# Patient Record
Sex: Female | Born: 1965 | Race: White | Hispanic: No | Smoking: Never smoker
Health system: Southern US, Community
[De-identification: ages and names within clinical notes are randomized; demographics above are authoritative.]

## PROBLEM LIST (undated history)

## (undated) DIAGNOSIS — R112 Nausea with vomiting, unspecified: Secondary | ICD-10-CM

## (undated) DIAGNOSIS — Z9889 Other specified postprocedural states: Secondary | ICD-10-CM

## (undated) DIAGNOSIS — R51 Headache: Secondary | ICD-10-CM

## (undated) DIAGNOSIS — Z202 Contact with and (suspected) exposure to infections with a predominantly sexual mode of transmission: Secondary | ICD-10-CM

## (undated) DIAGNOSIS — IMO0002 Reserved for concepts with insufficient information to code with codable children: Secondary | ICD-10-CM

## (undated) DIAGNOSIS — Z8669 Personal history of other diseases of the nervous system and sense organs: Secondary | ICD-10-CM

## (undated) DIAGNOSIS — Z8709 Personal history of other diseases of the respiratory system: Secondary | ICD-10-CM

## (undated) DIAGNOSIS — T8859XA Other complications of anesthesia, initial encounter: Secondary | ICD-10-CM

## (undated) DIAGNOSIS — T4145XA Adverse effect of unspecified anesthetic, initial encounter: Secondary | ICD-10-CM

## (undated) HISTORY — DX: Reserved for concepts with insufficient information to code with codable children: IMO0002

## (undated) HISTORY — DX: Contact with and (suspected) exposure to infections with a predominantly sexual mode of transmission: Z20.2

## (undated) HISTORY — DX: Personal history of other diseases of the respiratory system: Z87.09

## (undated) HISTORY — DX: Headache: R51

## (undated) HISTORY — PX: BREAST EXCISIONAL BIOPSY: SUR124

## (undated) HISTORY — PX: BREAST BIOPSY: SHX20

## (undated) HISTORY — PX: NASAL POLYP EXCISION: SHX2068

## (undated) HISTORY — DX: Personal history of other diseases of the nervous system and sense organs: Z86.69

---

## 1996-10-08 HISTORY — PX: BREAST CYST EXCISION: SHX579

## 2002-07-24 ENCOUNTER — Encounter: Admission: RE | Admit: 2002-07-24 | Discharge: 2002-07-24 | Payer: Self-pay

## 2003-07-23 ENCOUNTER — Other Ambulatory Visit: Admission: RE | Admit: 2003-07-23 | Discharge: 2003-07-23 | Payer: Self-pay | Admitting: Family Medicine

## 2003-12-10 ENCOUNTER — Encounter: Admission: RE | Admit: 2003-12-10 | Discharge: 2003-12-10 | Payer: Self-pay | Admitting: Family Medicine

## 2004-07-17 ENCOUNTER — Encounter: Admission: RE | Admit: 2004-07-17 | Discharge: 2004-08-09 | Payer: Self-pay | Admitting: Family Medicine

## 2004-09-14 ENCOUNTER — Encounter: Admission: RE | Admit: 2004-09-14 | Discharge: 2004-09-14 | Payer: Self-pay | Admitting: Family Medicine

## 2004-12-27 ENCOUNTER — Encounter: Admission: RE | Admit: 2004-12-27 | Discharge: 2004-12-27 | Payer: Self-pay | Admitting: Family Medicine

## 2006-01-30 ENCOUNTER — Encounter: Admission: RE | Admit: 2006-01-30 | Discharge: 2006-01-30 | Payer: Self-pay | Admitting: Family Medicine

## 2006-02-06 ENCOUNTER — Encounter: Admission: RE | Admit: 2006-02-06 | Discharge: 2006-02-06 | Payer: Self-pay | Admitting: Family Medicine

## 2006-02-15 ENCOUNTER — Ambulatory Visit: Payer: Self-pay | Admitting: Internal Medicine

## 2006-02-21 ENCOUNTER — Other Ambulatory Visit: Admission: RE | Admit: 2006-02-21 | Discharge: 2006-02-21 | Payer: Self-pay | Admitting: Obstetrics & Gynecology

## 2006-03-13 ENCOUNTER — Ambulatory Visit: Payer: Self-pay | Admitting: Internal Medicine

## 2006-03-14 ENCOUNTER — Ambulatory Visit: Payer: Self-pay | Admitting: Internal Medicine

## 2006-04-03 ENCOUNTER — Ambulatory Visit: Payer: Self-pay | Admitting: Internal Medicine

## 2006-04-22 ENCOUNTER — Ambulatory Visit: Payer: Self-pay | Admitting: Internal Medicine

## 2006-05-14 ENCOUNTER — Ambulatory Visit: Payer: Self-pay | Admitting: Internal Medicine

## 2006-06-03 ENCOUNTER — Ambulatory Visit: Payer: Self-pay | Admitting: Internal Medicine

## 2006-07-17 ENCOUNTER — Ambulatory Visit: Payer: Self-pay | Admitting: Internal Medicine

## 2006-09-26 ENCOUNTER — Ambulatory Visit: Payer: Self-pay | Admitting: Internal Medicine

## 2007-02-04 ENCOUNTER — Encounter: Admission: RE | Admit: 2007-02-04 | Discharge: 2007-02-04 | Payer: Self-pay | Admitting: Family Medicine

## 2007-03-24 ENCOUNTER — Ambulatory Visit: Payer: Self-pay | Admitting: Internal Medicine

## 2007-03-25 ENCOUNTER — Ambulatory Visit: Payer: Self-pay | Admitting: Internal Medicine

## 2007-04-15 ENCOUNTER — Other Ambulatory Visit: Admission: RE | Admit: 2007-04-15 | Discharge: 2007-04-15 | Payer: Self-pay | Admitting: Obstetrics & Gynecology

## 2007-07-18 DIAGNOSIS — R519 Headache, unspecified: Secondary | ICD-10-CM | POA: Insufficient documentation

## 2007-07-18 DIAGNOSIS — J309 Allergic rhinitis, unspecified: Secondary | ICD-10-CM | POA: Insufficient documentation

## 2007-07-18 DIAGNOSIS — G43909 Migraine, unspecified, not intractable, without status migrainosus: Secondary | ICD-10-CM | POA: Insufficient documentation

## 2007-07-18 DIAGNOSIS — J330 Polyp of nasal cavity: Secondary | ICD-10-CM | POA: Insufficient documentation

## 2007-07-18 DIAGNOSIS — R51 Headache: Secondary | ICD-10-CM | POA: Insufficient documentation

## 2007-07-18 DIAGNOSIS — Z9889 Other specified postprocedural states: Secondary | ICD-10-CM | POA: Insufficient documentation

## 2007-09-22 ENCOUNTER — Ambulatory Visit: Payer: Self-pay | Admitting: Internal Medicine

## 2007-12-11 ENCOUNTER — Ambulatory Visit: Payer: Self-pay | Admitting: Internal Medicine

## 2008-02-06 ENCOUNTER — Encounter: Admission: RE | Admit: 2008-02-06 | Discharge: 2008-02-06 | Payer: Self-pay | Admitting: Family Medicine

## 2008-03-23 ENCOUNTER — Encounter: Payer: Self-pay | Admitting: Internal Medicine

## 2008-04-19 ENCOUNTER — Other Ambulatory Visit: Admission: RE | Admit: 2008-04-19 | Discharge: 2008-04-19 | Payer: Self-pay | Admitting: Obstetrics & Gynecology

## 2008-05-26 ENCOUNTER — Encounter: Admission: RE | Admit: 2008-05-26 | Discharge: 2008-05-26 | Payer: Self-pay | Admitting: Obstetrics & Gynecology

## 2008-05-28 ENCOUNTER — Encounter: Admission: RE | Admit: 2008-05-28 | Discharge: 2008-05-28 | Payer: Self-pay | Admitting: Interventional Radiology

## 2008-06-08 HISTORY — PX: UTERINE ARTERY EMBOLIZATION: SHX2629

## 2008-06-11 ENCOUNTER — Ambulatory Visit (HOSPITAL_COMMUNITY): Admission: RE | Admit: 2008-06-11 | Discharge: 2008-06-12 | Payer: Self-pay | Admitting: Interventional Radiology

## 2008-09-07 ENCOUNTER — Encounter: Admission: RE | Admit: 2008-09-07 | Discharge: 2008-09-07 | Payer: Self-pay | Admitting: Interventional Radiology

## 2008-09-27 ENCOUNTER — Ambulatory Visit: Payer: Self-pay | Admitting: Internal Medicine

## 2009-02-22 ENCOUNTER — Telehealth (INDEPENDENT_AMBULATORY_CARE_PROVIDER_SITE_OTHER): Payer: Self-pay | Admitting: *Deleted

## 2009-03-02 ENCOUNTER — Encounter: Admission: RE | Admit: 2009-03-02 | Discharge: 2009-03-02 | Payer: Self-pay | Admitting: Obstetrics & Gynecology

## 2009-04-04 ENCOUNTER — Ambulatory Visit: Payer: Self-pay | Admitting: Internal Medicine

## 2009-04-07 ENCOUNTER — Telehealth (INDEPENDENT_AMBULATORY_CARE_PROVIDER_SITE_OTHER): Payer: Self-pay | Admitting: *Deleted

## 2009-04-13 ENCOUNTER — Ambulatory Visit: Payer: Self-pay | Admitting: Internal Medicine

## 2009-05-03 ENCOUNTER — Encounter: Admission: RE | Admit: 2009-05-03 | Discharge: 2009-05-03 | Payer: Self-pay | Admitting: Obstetrics & Gynecology

## 2009-05-03 ENCOUNTER — Encounter: Admission: RE | Admit: 2009-05-03 | Discharge: 2009-05-03 | Payer: Self-pay | Admitting: Interventional Radiology

## 2010-03-17 ENCOUNTER — Encounter: Admission: RE | Admit: 2010-03-17 | Discharge: 2010-03-17 | Payer: Self-pay | Admitting: Obstetrics & Gynecology

## 2010-04-11 ENCOUNTER — Telehealth: Payer: Self-pay | Admitting: Internal Medicine

## 2010-10-11 ENCOUNTER — Ambulatory Visit
Admission: RE | Admit: 2010-10-11 | Discharge: 2010-10-11 | Payer: Self-pay | Source: Home / Self Care | Attending: Internal Medicine | Admitting: Internal Medicine

## 2010-10-11 ENCOUNTER — Encounter: Payer: Self-pay | Admitting: Internal Medicine

## 2010-10-25 ENCOUNTER — Ambulatory Visit: Payer: Self-pay | Admitting: Internal Medicine

## 2010-11-07 NOTE — Progress Notes (Signed)
Summary: nos appt  Phone Note Call from Patient   Caller: [email or web address]  Call For: Abdullahi Vallone Summary of Call: LMTCB to rsc nos from 7/1. Initial call taken by: Darletta Moll,  April 11, 2010 3:14 PM

## 2010-11-09 NOTE — Assessment & Plan Note (Signed)
Summary: rov//sh   Primary Provider/Referring Provider:  C. Duane Lope  CC:  Follow-up visit , pt has questions about decreasing allergy vaccine, states higher dose is causing headaches, and needs refill for epi-pen.  History of Present Illness:  09/27/08-Allergic rhinitis, hx nasal polyps Now on 1:10. Local reactions GO vax 0.1-0.4 ml. Only local rxns. Has epipen. Works at [employer].Has just given herself 0.3 ml each vial. Vaccine does control headache. Discussed relation to her headaches. Has 4 cm slightly warm local reaction after 10 minutes post injection. Denies wheeze. Takes daily Zyrtec.  04/04/09- Allergic rhinitis, hx nasal polyps She says allergy vaccine is working perfectly. She is holding at 0.2 ml 1:10 . Headaches are better, in a study using an imitrex patch for 4 hours whenever she gets a headache- every 3 weeks or so now. She really likes [employer]. No longer using Zyrtec and trying to use mostly natural treatments.  October 11, 2010-  Allergic rhinitis, hx nasal polyps Nurse-CC: Follow-up visit , pt has questions about decreasing allergy vaccine,states higher dose is causing headaches, needs refill for epi-pen She gives her own allergy vaccine. Was getting shots of 1:10, 0.1 ml every 2 weeks. Reports headache starting an hour after each shot, which she tried to address by increasing interval to once monthly. Headache was the primary reason to explore shots in first place. We discussed instead going back to the 1:50 strength and rebuilding from 0.1 ml as tolerated.  Has worked wih headache center on trials related to headaches. Does note nasal discharge, sinus pressure.     Preventive Screening-Counseling & Management  Alcohol-Tobacco     Smoking Status: never  Current Medications (verified): 1)  Multivitamins   Tabs (Multiple Vitamin) .... Take 1 Tablet By Mouth Once A Day 2)  Allergy Vaccine 1:10 Go (W-E) 3)  Zyrtec Allergy 10 Mg  Tabs (Cetirizine Hcl) .... Take 1 Tablet By  Mouth Once A Day As Needed 4)  Epipen 2-Pak 0.3 Mg/0.41ml (1:1000)  Devi (Epinephrine Hcl (Anaphylaxis)) .... For Needed For Severe Allergic Reaction  Allergies (verified): 1)  ! Penicillin 2)  ! Aspirin 3)  ! Sulfa 4)  ! Tetracycline  Past History:  Past Medical History: Last updated: 09/27/2008 Allergic rhinitis Headache Hx nasal polyps  Past Surgical History: Last updated: 09/27/2008 C- section x3 Uterine arterial embolization nasal polypectomy x 2  Family History: Last updated: 04/04/2009 Parents living Allergy Asthma HTN  Social History: Last updated: 09/27/2008 Patient never smoked.  Nurse [employer]  Risk Factors: Smoking Status: never (10/11/2010)  Review of Systems      See HPI       The patient complains of headaches and nasal congestion/difficulty breathing through nose.  The patient denies shortness of breath with activity, shortness of breath at rest, productive cough, non-productive cough, coughing up blood, chest pain, irregular heartbeats, acid heartburn, indigestion, loss of appetite, weight change, abdominal pain, difficulty swallowing, sore throat, tooth/dental problems, and sneezing.    Vital Signs:  Patient profile:   45 year old female Height:      63 inches Weight:      101 pounds BMI:     17.96 O2 Sat:      100 % on Room air Pulse rate:   82 / minute BP sitting:   108 / 78  (left arm)  Vitals Entered By: Renold Genta RCP, LPN (October 11, 2010 11:09 AM)  O2 Flow:  Room air CC: Follow-up visit , pt has questions about  decreasing allergy vaccine,states higher dose is causing headaches, needs refill for epi-pen Comments Medications reviewed with patient Renold Genta RCP, LPN  October 11, 2010 11:12 AM    Physical Exam  Additional Exam:  General: A/Ox3; pleasant and cooperative, NAD, slender, comfortable appearing SKIN: no rash, lesions NODES: no lymphadenopathy HEENT: [place]/AT, EOM- WNL, Conjuctivae- clear, PERRLA, TM-WNL, Nose- narrow.  clear, Throat- clear and wnl, Mallampati II NECK: Supple w/ fair ROM, JVD- none, normal carotid impulses w/o bruits Thyroid-  CHEST: Clear to P&A HEART: RRR, no m/g/r heard ABDOMEN: Soft and nl;  ZOX:WRUE, nl pulses, no edema  NEURO: Grossly intact to observation       Impression & Recommendations:  Problem # 1:  ALLERGIC RHINITIS (ICD-477.9)  I don't see polyps, and I discussed alternatives to allergy as basis for headache. We will reduce her vaccine strength back to 1:50.  The following medications were removed from the medication list:    Fluticasone Propionate 50 Mcg/act Susp (Fluticasone propionate) .Marland Kitchen... 2 spray each nostril daily Her updated medication list for this problem includes:    Zyrtec Allergy 10 Mg Tabs (Cetirizine hcl) .Marland Kitchen... Take 1 tablet by mouth once a day as needed  Other Orders: Est. Patient Level III (45409)  Patient Instructions: 1)  Please schedule a follow-up appointment in 4 months. 2)  I will have the allergy lab remix your vaccine to the 1:50 strength 3)  Start this at 0.1 ml/ vial every 2 weeks. As you are able, increase the dose by 0.1 ml/ vial/ week and hold at the highest voume you are comfortable with.   Please call wit any questions.  4)  Refill scripts for syringes and Epipen.  Prescriptions: EPIPEN 2-PAK 0.3 MG/0.3ML (1:1000)  DEVI (EPINEPHRINE HCL (ANAPHYLAXIS)) For needed for severe allergic reaction  #1 x prn   Entered and Authorized by:   Waymon Budge MD   Signed by:   Waymon Budge MD on 10/11/2010   Method used:   Print then Give to Patient   RxID:   [id number]

## 2010-11-09 NOTE — Miscellaneous (Signed)
Summary: Vaccine Rx  Vaccine Rx   Imported By: Lester Shoshone 10/20/2010 11:15:02  _____________________________________________________________________  External Attachment:    Type:   Image     Comment:   External Document

## 2011-02-07 ENCOUNTER — Encounter: Payer: Self-pay | Admitting: Internal Medicine

## 2011-02-09 ENCOUNTER — Ambulatory Visit: Payer: Self-pay | Admitting: Internal Medicine

## 2011-02-20 NOTE — Assessment & Plan Note (Signed)
[facility]                             PULMONARY OFFICE NOTE   NAME:Marie Gross, Marie Gross                           MRN:          [id number]  DATE:03/24/2007                            DOB:          May 29, 1966    PROBLEMS:  1. Allergic rhinitis.  2. Migraine.  3. History of nasal polyps.  4. Headache.   HISTORY:  She was having more frequent local reactions to her allergy  vaccine lasting no more than an hour.  That seems to have settled down  but we agreed we will hold her allergy vaccine for now at 1:50.  She has  not had any systemic reaction concerns but does have an Epipen.  She is  convinced her allergy shots have helped.  Her headaches are much better.  She is also using Astelin, Claritin and Flonase.  No recent hives, no  asthma.   MEDICATIONS:  1. Astelin.  2. Flonase.  3. Allergy vaccine.  4. Claritin.  5. Epipen p.r.n.   ALLERGIES AND INTOLERANCES:  PENICILLIN, ASPIRIN, SULFA and  TETRACYCLINE.   OBJECTIVE:  VITAL SIGNS:  Weight 97 pounds, blood pressure 110/62, pulse  70, room air saturation 100%.  HEENT:  Nasal mucosa looks normal, may be a little pale on the left but  without polyps or drainage.  Voice quality is normal.  Pharynx clear.  LUNGS:  Clear.  CARDIOVASCULAR:  Hearts sounds normal.   IMPRESSION:  Allergic rhinitis with previous history of nasal polyps, so  far not recurrent.  Good control.   PLAN:  Will hold her allergy vaccine at 0.5 of 1:50 once a week.  We can  reconsider further increase when she returns in six months.  I have  refilled her Flonase and Astelin.     Clinton D. Maple Hudson, MD, Tonny Bollman, FACP  Electronically Signed   CDY/MedQ  DD: 03/25/2007  DT: 03/26/2007  Job #: [id number]   cc:   C. Duane Lope, M.D.

## 2011-02-23 NOTE — Assessment & Plan Note (Signed)
[facility]                               PULMONARY OFFICE NOTE   NAME:Marie Gross, Marie Gross                           MRN:          [id number]  DATE:07/17/2006                            DOB:          1966/03/30   PULMONARY/ALLERGY FOLLOW UP:   PROBLEM:  1. Allergic rhinitis.  2. Migraine.  3. History of nasal polyps.  4. Headache.   HISTORY:  She is building allergy vaccine towards maintenance with no  problems.  We have reviewed risk and safety issues and she has an EpiPen  which we reviewed.  She says since she has been on allergy vaccine she has  not had headaches.  I explained it was probably to early for there to be a  connection but regardless, she is pleased.  She takes occasional  decongestants still to help with a sense of maxillary and left frontal sinus  pressure, but she has had no discharge or sneezing.   MEDICATION:  Astelin, Flonase, allergy vaccine, Claritin or Zyrtec, EpiPen.   DRUG INTOLERANCE:  To PENICILLIN, ASPIRIN, SULFA and TETRACYCLINE.   OBJECTIVE:  Weight 99 pounds.  Blood pressure 92/60.  Pulse regular 87.  Room air saturation 97%.  Nasal mucosa is pale and boggy with little mucous, no postnasal drainage.  She is able to breathe through her nose with her mouth closed.  Conjunctivae are not injected.  Pharynx is clear.  LUNGS:  Are clear.  HEART:  Sounds regular without murmur.   IMPRESSION:  1. Allergic rhinitis.  2. History of nasal polyposis with surgery x2 but no visible polyps at      this time.   PLAN:  1. Continue present medications.  2. Schedule return in 6 months with question of advancing vaccine then to      1:10.       Clinton D. Maple Hudson, MD, FCCP, FACP   CDY/MedQ DD:  07/21/2006 DT:  07/22/2006 Job #:  [id number]   cc:   C. Duane Lope, M.D.

## 2011-04-16 ENCOUNTER — Other Ambulatory Visit: Payer: Self-pay | Admitting: Obstetrics & Gynecology

## 2011-04-16 DIAGNOSIS — Z1231 Encounter for screening mammogram for malignant neoplasm of breast: Secondary | ICD-10-CM

## 2011-04-24 ENCOUNTER — Ambulatory Visit
Admission: RE | Admit: 2011-04-24 | Discharge: 2011-04-24 | Disposition: A | Discharge status: Home / Self Care | Payer: BC Managed Care – PPO | Source: Ambulatory Visit | Attending: Obstetrics & Gynecology | Admitting: Obstetrics & Gynecology

## 2011-04-24 DIAGNOSIS — Z1231 Encounter for screening mammogram for malignant neoplasm of breast: Secondary | ICD-10-CM

## 2011-07-11 LAB — CBC
Hemoglobin: 11.8 — ABNORMAL LOW
Platelets: 271
RDW: 12.8
WBC: 5.6

## 2011-07-11 LAB — CREATININE, SERUM: Creatinine, Ser: 0.72

## 2011-07-11 LAB — HCG, SERUM, QUALITATIVE: Preg, Serum: NEGATIVE

## 2011-10-09 HISTORY — PX: WRIST SURGERY: SHX841

## 2011-11-23 ENCOUNTER — Encounter (HOSPITAL_COMMUNITY): Payer: Self-pay | Admitting: *Deleted

## 2011-11-23 ENCOUNTER — Emergency Department (HOSPITAL_COMMUNITY): Payer: BC Managed Care – PPO

## 2011-11-23 ENCOUNTER — Emergency Department (HOSPITAL_COMMUNITY)
Admission: EM | Admit: 2011-11-23 | Discharge: 2011-11-23 | Disposition: A | Discharge status: Home / Self Care | Payer: BC Managed Care – PPO | Attending: Emergency Medicine | Admitting: Emergency Medicine

## 2011-11-23 DIAGNOSIS — S59909A Unspecified injury of unspecified elbow, initial encounter: Secondary | ICD-10-CM | POA: Insufficient documentation

## 2011-11-23 DIAGNOSIS — S6990XA Unspecified injury of unspecified wrist, hand and finger(s), initial encounter: Secondary | ICD-10-CM | POA: Insufficient documentation

## 2011-11-23 DIAGNOSIS — W19XXXA Unspecified fall, initial encounter: Secondary | ICD-10-CM | POA: Insufficient documentation

## 2011-11-23 DIAGNOSIS — S52502A Unspecified fracture of the lower end of left radius, initial encounter for closed fracture: Secondary | ICD-10-CM

## 2011-11-23 DIAGNOSIS — S52599A Other fractures of lower end of unspecified radius, initial encounter for closed fracture: Secondary | ICD-10-CM | POA: Insufficient documentation

## 2011-11-23 DIAGNOSIS — S59919A Unspecified injury of unspecified forearm, initial encounter: Secondary | ICD-10-CM | POA: Insufficient documentation

## 2011-11-23 DIAGNOSIS — M25539 Pain in unspecified wrist: Secondary | ICD-10-CM | POA: Insufficient documentation

## 2011-11-23 DIAGNOSIS — R209 Unspecified disturbances of skin sensation: Secondary | ICD-10-CM | POA: Insufficient documentation

## 2011-11-23 DIAGNOSIS — M25439 Effusion, unspecified wrist: Secondary | ICD-10-CM | POA: Insufficient documentation

## 2011-11-23 MED ORDER — OXYCODONE-ACETAMINOPHEN 5-325 MG PO TABS
1.0000 | ORAL_TABLET | Freq: Once | ORAL | Status: AC
  Administered 2011-11-23: 1 via ORAL
  Filled 2011-11-23: qty 1

## 2011-11-23 MED ORDER — NAPROXEN 500 MG PO TABS: 500.0000 mg | ORAL_TABLET | Freq: Two times a day (BID) | ORAL | Status: AC

## 2011-11-23 MED ORDER — ONDANSETRON 8 MG PO TBDP
8.0000 mg | ORAL_TABLET | Freq: Once | ORAL | Status: AC
  Administered 2011-11-23: 8 mg via ORAL
  Filled 2011-11-23: qty 1

## 2011-11-23 MED ORDER — OXYCODONE-ACETAMINOPHEN 5-325 MG PO TABS: 1.0000 | ORAL_TABLET | Freq: Four times a day (QID) | ORAL | Status: AC | PRN

## 2011-11-23 NOTE — Discharge Instructions (Signed)
Please read and follow instructions below.   You have a fracture of your distal radius.   This will require followup by an orthopedic doctor. As we discussed, call Dr. Merrilee Seashore office for a followup appointment on Monday.  Use pain medication as directed. Keep hand elevated as much as possible.  Please return to the Emergency Department if you have any concerns.

## 2011-11-23 NOTE — ED Notes (Signed)
Pt in s/p fall while running c/o left wrist pain, deformity noted, CMS intact

## 2011-11-23 NOTE — ED Provider Notes (Signed)
History     CSN: [id number]  Arrival date & time 11/23/11  0944   First MD Initiated Contact with Patient 11/23/11 1000      Chief Complaint  Patient presents with  . Wrist Injury    (Consider location/radiation/quality/duration/timing/severity/associated sxs/prior treatment) HPI Comments: Patient presents with wrist pain and deformity after a fall onto outstretched L wrist. No treatments prior. States 'numb' feeling over L elbow but no pain and full ROM. No L shoulder pain. No neck pain. Fall was mechanical, no LOC.   Patient is a 46 y.o. female presenting with wrist injury. The history is provided by the patient.  Wrist Injury  The incident occurred less than 1 hour ago. The injury mechanism was a fall. The pain is present in the left wrist. The pain is moderate. The pain has been constant since the incident. Pertinent negatives include no fever. She has tried nothing for the symptoms.    Past Medical History  Diagnosis Date  . Allergic rhinitis   . Headache   . History of nasal polyp     Past Surgical History  Procedure Date  . Cesarean section     x3  . Uterine artery embolization   . Nasal polyp excision     x2    Family History  Problem Relation Age of Onset  . Allergies Other   . Asthma Other   . Hypertension Other     History  Substance Use Topics  . Smoking status: Never Smoker   . Smokeless tobacco: Not on file  . Alcohol Use: Not on file    OB History    Grav Para Term Preterm Abortions TAB SAB Ect Mult Living                  Review of Systems  Constitutional: Negative for fever.  HENT: Negative for neck pain.   Eyes: Negative for redness.  Respiratory: Negative for shortness of breath.   Cardiovascular: Negative for chest pain.  Gastrointestinal: Negative for nausea and vomiting.  Genitourinary: Negative for dysuria.  Musculoskeletal: Positive for joint swelling and arthralgias. Negative for myalgias and back pain.  Skin: Negative for  color change and wound.  Neurological: Negative for headaches.    Allergies  Penicillins; Sulfonamide derivatives; and Tetracycline  Home Medications   Current Outpatient Rx  Name Route Sig Dispense Refill  . EPINEPHRINE 0.3 MG/0.3ML IJ DEVI Intramuscular Inject 0.3 mg into the muscle once. As needed for severe allergic reaction     . FLUTICASONE PROPIONATE 50 MCG/ACT NA SUSP Nasal Place 2 sprays into the nose daily as needed. For allergies.    . NORETHINDRONE 0.35 MG PO TABS Oral Take 1 tablet by mouth daily.    . SUMATRIPTAN SUCCINATE 100 MG PO TABS Oral Take 100 mg by mouth every 2 (two) hours as needed. For migraines.      BP 136/89  Pulse 80  Temp(Src) 98.5 F (36.9 C) (Oral)  Resp 20  SpO2 100%  Physical Exam  Nursing note and vitals reviewed. Constitutional: She is oriented to person, place, and time. She appears well-developed and well-nourished.  HENT:  Head: Normocephalic and atraumatic.  Eyes: Conjunctivae are normal. Pupils are equal, round, and reactive to light.  Neck: Normal range of motion. Neck supple.  Cardiovascular: Normal rate, regular rhythm and normal heart sounds.   Pulses:      Radial pulses are 2+ on the right side, and 2+ on the left side.  Pulmonary/Chest: Effort normal  and breath sounds normal. No respiratory distress.  Abdominal: Soft. There is no tenderness.  Musculoskeletal:       Left shoulder: Normal. She exhibits normal range of motion and no tenderness.       Left elbow: She exhibits normal range of motion and no swelling. no tenderness found.       Left wrist: She exhibits decreased range of motion, tenderness, bony tenderness, swelling and deformity. She exhibits no laceration.       Left upper arm: Normal. She exhibits no tenderness and no bony tenderness.       Left forearm: Normal. She exhibits no tenderness, no bony tenderness and no swelling.       Arms:      Left hand: Normal. normal sensation noted. Normal strength noted.    Neurological: She is alert and oriented to person, place, and time.  Skin: Skin is warm and dry.  Psychiatric: She has a normal mood and affect.    ED Course  Procedures (including critical care time)  Labs Reviewed - No data to display Dg Wrist Complete Left  11/23/2011  *RADIOLOGY REPORT*  Clinical Data: Larey Seat, wrist deformity  LEFT WRIST - COMPLETE 3+ VIEW  Comparison: None.  Findings: There is an impacted transverse fracture of the distal left radius without significant angulation.  The radiocarpal joint space appears normal and the carpal bones are in normal position.  IMPRESSION: Transverse comminuted impacted fracture of the distal left radius.  Original Report Authenticated By: Juline Patch, M.D.     1. Fracture of left distal radius     11:19 AM Patient seen and examined. Work-up initiated. Medications ordered. Distal CNS intact. No elbow or shoulder pain.  Vital signs reviewed and are as follows: Filed Vitals:   11/23/11 0950  BP: 136/89  Pulse: 80  Temp: 98.5 F (36.9 C)  Resp: 20   11:19 AM Patient was discussed with Loren Racer, MD. X-rays reviewed.   11:19 AM Spoke with Dr. Merrilee Seashore office. He will look at films and call back with any change in plans. Instructed to splint, give pain medication, follow-up on Monday. Patient instructed. She verbalizes understanding and agrees with plan.   Volar splint and sling placed by orthopedic tech.   Patient was counseled on RICE protocol and told to rest injury, use ice for no longer than 15 minutes every hour, and elevate above the level of their heart as much as possible to reduce swelling.  Questions answered.  Patient verbalized understanding.    Patient counseled on use of narcotic pain medications. Counseled not to combine these medications with others containing tylenol. Urged not to drink alcohol, drive, or perform any other activities that requires focus while taking these medications. The patient verbalizes  understanding and agrees with the plan.  MDM  Distal radius fracture. Distal sensation, vascular, and motor intact. No concern for ischemia or neurologic injury. Discussed with ortho and follow-up arranged. Given only mild displacement, no reduction is indicated today.         Eustace Moore [place], [place] 11/23/11 [phone]

## 2011-11-24 NOTE — ED Provider Notes (Signed)
Medical screening examination/treatment/procedure(s) were performed by non-physician practitioner and as supervising physician I was immediately available for consultation/collaboration.   Loren Racer, MD 11/24/11 [phone]

## 2011-11-27 ENCOUNTER — Other Ambulatory Visit: Payer: Self-pay | Admitting: Orthopedic Surgery

## 2011-11-27 ENCOUNTER — Encounter (HOSPITAL_BASED_OUTPATIENT_CLINIC_OR_DEPARTMENT_OTHER): Payer: Self-pay | Admitting: *Deleted

## 2011-11-27 NOTE — Progress Notes (Signed)
No labs needed Allergies-no asthma

## 2011-11-29 ENCOUNTER — Encounter (HOSPITAL_BASED_OUTPATIENT_CLINIC_OR_DEPARTMENT_OTHER): Payer: Self-pay | Admitting: *Deleted

## 2011-11-29 ENCOUNTER — Encounter (HOSPITAL_BASED_OUTPATIENT_CLINIC_OR_DEPARTMENT_OTHER)
Admission: RE | Disposition: A | Discharge status: Home / Self Care | Payer: Self-pay | Source: Ambulatory Visit | Attending: Orthopedic Surgery

## 2011-11-29 ENCOUNTER — Encounter (HOSPITAL_BASED_OUTPATIENT_CLINIC_OR_DEPARTMENT_OTHER): Payer: Self-pay | Admitting: Orthopedic Surgery

## 2011-11-29 ENCOUNTER — Encounter (HOSPITAL_BASED_OUTPATIENT_CLINIC_OR_DEPARTMENT_OTHER): Payer: Self-pay | Admitting: Certified Registered Nurse Anesthetist

## 2011-11-29 ENCOUNTER — Ambulatory Visit (HOSPITAL_BASED_OUTPATIENT_CLINIC_OR_DEPARTMENT_OTHER): Payer: BC Managed Care – PPO | Admitting: Certified Registered Nurse Anesthetist

## 2011-11-29 ENCOUNTER — Ambulatory Visit (HOSPITAL_BASED_OUTPATIENT_CLINIC_OR_DEPARTMENT_OTHER)
Admission: RE | Admit: 2011-11-29 | Discharge: 2011-11-29 | Disposition: A | Discharge status: Home / Self Care | Payer: BC Managed Care – PPO | Source: Ambulatory Visit | Attending: Orthopedic Surgery | Admitting: Orthopedic Surgery

## 2011-11-29 DIAGNOSIS — J309 Allergic rhinitis, unspecified: Secondary | ICD-10-CM | POA: Insufficient documentation

## 2011-11-29 DIAGNOSIS — S52599A Other fractures of lower end of unspecified radius, initial encounter for closed fracture: Secondary | ICD-10-CM | POA: Insufficient documentation

## 2011-11-29 DIAGNOSIS — W19XXXA Unspecified fall, initial encounter: Secondary | ICD-10-CM | POA: Insufficient documentation

## 2011-11-29 HISTORY — DX: Other complications of anesthesia, initial encounter: T88.59XA

## 2011-11-29 HISTORY — DX: Adverse effect of unspecified anesthetic, initial encounter: T41.45XA

## 2011-11-29 HISTORY — DX: Other specified postprocedural states: Z98.890

## 2011-11-29 HISTORY — DX: Nausea with vomiting, unspecified: R11.2

## 2011-11-29 LAB — POCT HEMOGLOBIN-HEMACUE: Hemoglobin: 11.6 g/dL — ABNORMAL LOW (ref 12.0–15.0)

## 2011-11-29 SURGERY — OPEN REDUCTION INTERNAL FIXATION (ORIF) DISTAL RADIUS FRACTURE
Anesthesia: General | Laterality: Left

## 2011-11-29 MED ORDER — LIDOCAINE-EPINEPHRINE 1.5-1:200000 % IJ SOLN
INTRAMUSCULAR | Status: DC | PRN
  Administered 2011-11-29: 10 mg via INTRADERMAL

## 2011-11-29 MED ORDER — LACTATED RINGERS IV SOLN
INTRAVENOUS | Status: DC
  Administered 2011-11-29 (×2): via INTRAVENOUS

## 2011-11-29 MED ORDER — LIDOCAINE HCL (CARDIAC) 20 MG/ML IV SOLN
INTRAVENOUS | Status: DC | PRN
  Administered 2011-11-29: 40 mg via INTRAVENOUS

## 2011-11-29 MED ORDER — BUPIVACAINE-EPINEPHRINE PF 0.5-1:200000 % IJ SOLN
INTRAMUSCULAR | Status: DC | PRN
  Administered 2011-11-29: 25 mg

## 2011-11-29 MED ORDER — PROMETHAZINE HCL 25 MG/ML IJ SOLN: 6.2500 mg | INTRAMUSCULAR | Status: DC | PRN

## 2011-11-29 MED ORDER — FENTANYL CITRATE 0.05 MG/ML IJ SOLN
50.0000 ug | INTRAMUSCULAR | Status: DC | PRN
  Administered 2011-11-29: 50 ug via INTRAVENOUS

## 2011-11-29 MED ORDER — ONDANSETRON HCL 4 MG/2ML IJ SOLN
INTRAMUSCULAR | Status: DC | PRN
  Administered 2011-11-29: 4 mg via INTRAVENOUS

## 2011-11-29 MED ORDER — MIDAZOLAM HCL 2 MG/2ML IJ SOLN
1.0000 mg | INTRAMUSCULAR | Status: DC | PRN
  Administered 2011-11-29 (×2): 1 mg via INTRAVENOUS

## 2011-11-29 MED ORDER — PROPOFOL 10 MG/ML IV EMUL
INTRAVENOUS | Status: DC | PRN
  Administered 2011-11-29: 200 mg via INTRAVENOUS

## 2011-11-29 MED ORDER — OXYCODONE-ACETAMINOPHEN 5-325 MG PO TABS: ORAL_TABLET | ORAL | Status: AC

## 2011-11-29 MED ORDER — VANCOMYCIN HCL 1000 MG IV SOLR
1000.0000 mg | INTRAVENOUS | Status: DC | PRN
  Administered 2011-11-29: 1000 mg via INTRAVENOUS

## 2011-11-29 MED ORDER — CHLORHEXIDINE GLUCONATE 4 % EX LIQD: 60.0000 mL | Freq: Once | CUTANEOUS | Status: DC

## 2011-11-29 MED ORDER — SCOPOLAMINE 1 MG/3DAYS TD PT72
1.0000 | MEDICATED_PATCH | TRANSDERMAL | Status: DC
  Administered 2011-11-29: 1.5 mg via TRANSDERMAL

## 2011-11-29 MED ORDER — MIDAZOLAM HCL 5 MG/5ML IJ SOLN
INTRAMUSCULAR | Status: DC | PRN
  Administered 2011-11-29: 1 mg via INTRAVENOUS

## 2011-11-29 MED ORDER — ONDANSETRON HCL 4 MG PO TABS: 4.0000 mg | ORAL_TABLET | Freq: Three times a day (TID) | ORAL | Status: AC | PRN

## 2011-11-29 MED ORDER — DEXAMETHASONE SODIUM PHOSPHATE 10 MG/ML IJ SOLN
INTRAMUSCULAR | Status: DC | PRN
  Administered 2011-11-29: 10 mg via INTRAVENOUS

## 2011-11-29 MED ORDER — HYDROMORPHONE HCL PF 1 MG/ML IJ SOLN: 0.2500 mg | INTRAMUSCULAR | Status: DC | PRN

## 2011-11-29 SURGICAL SUPPLY — 67 items
BANDAGE CONFORM 3  STR LF (GAUZE/BANDAGES/DRESSINGS) IMPLANT
BANDAGE ELASTIC 3 VELCRO ST LF (GAUZE/BANDAGES/DRESSINGS) ×2 IMPLANT
BANDAGE GAUZE ELAST BULKY 4 IN (GAUZE/BANDAGES/DRESSINGS) ×2 IMPLANT
BIT DRILL 2.0 LNG QUCK RELEASE (BIT) IMPLANT
BIT DRILL 2.8X5 QR DISP (BIT) ×1 IMPLANT
BLADE MINI RND TIP GREEN BEAV (BLADE) IMPLANT
BLADE SURG 15 STRL LF DISP TIS (BLADE) ×2 IMPLANT
BLADE SURG 15 STRL SS (BLADE) ×4
BNDG CMPR 9X4 STRL LF SNTH (GAUZE/BANDAGES/DRESSINGS) ×1
BNDG CMPR MD 5X2 ELC HKLP STRL (GAUZE/BANDAGES/DRESSINGS)
BNDG ELASTIC 2 VLCR STRL LF (GAUZE/BANDAGES/DRESSINGS) ×1 IMPLANT
BNDG ESMARK 4X9 LF (GAUZE/BANDAGES/DRESSINGS) ×2 IMPLANT
CHLORAPREP W/TINT 26ML (MISCELLANEOUS) ×2 IMPLANT
CLOTH BEACON ORANGE TIMEOUT ST (SAFETY) ×2 IMPLANT
CORDS BIPOLAR (ELECTRODE) ×2 IMPLANT
COVER MAYO STAND STRL (DRAPES) ×2 IMPLANT
COVER TABLE BACK 60X90 (DRAPES) ×2 IMPLANT
DRAPE EXTREMITY T 121X128X90 (DRAPE) ×2 IMPLANT
DRAPE OEC MINIVIEW 54X84 (DRAPES) ×2 IMPLANT
DRAPE SURG 17X23 STRL (DRAPES) ×2 IMPLANT
DRILL 2.0 LNG QUICK RELEASE (BIT) ×2
GAUZE XEROFORM 1X8 LF (GAUZE/BANDAGES/DRESSINGS) ×2 IMPLANT
GLOVE BIO SURGEON STRL SZ7.5 (GLOVE) ×2 IMPLANT
GLOVE BIOGEL PI IND STRL 8 (GLOVE) ×1 IMPLANT
GLOVE BIOGEL PI IND STRL 8.5 (GLOVE) IMPLANT
GLOVE BIOGEL PI INDICATOR 8 (GLOVE) ×1
GLOVE BIOGEL PI INDICATOR 8.5 (GLOVE)
GLOVE SURG ORTHO 8.0 STRL STRW (GLOVE) IMPLANT
GOWN PREVENTION PLUS XLARGE (GOWN DISPOSABLE) ×2 IMPLANT
GOWN STRL REIN XL XLG (GOWN DISPOSABLE) ×2 IMPLANT
GUIDEWIRE ORTHO 0.054X6 (WIRE) ×3 IMPLANT
NDL HYPO 25X1 1.5 SAFETY (NEEDLE) IMPLANT
NEEDLE HYPO 22GX1.5 SAFETY (NEEDLE) IMPLANT
NEEDLE HYPO 25X1 1.5 SAFETY (NEEDLE) IMPLANT
NS IRRIG 1000ML POUR BTL (IV SOLUTION) ×2 IMPLANT
PACK BASIN DAY SURGERY FS (CUSTOM PROCEDURE TRAY) ×2 IMPLANT
PAD CAST 3X4 CTTN HI CHSV (CAST SUPPLIES) ×1 IMPLANT
PAD CAST 4YDX4 CTTN HI CHSV (CAST SUPPLIES) IMPLANT
PADDING CAST ABS 4INX4YD NS (CAST SUPPLIES)
PADDING CAST ABS COTTON 4X4 ST (CAST SUPPLIES) ×1 IMPLANT
PADDING CAST COTTON 3X4 STRL (CAST SUPPLIES) ×2
PADDING CAST COTTON 4X4 STRL (CAST SUPPLIES)
PLATE LEFT DIST RADIUS NARROW (Plate) ×1 IMPLANT
SCREW 3.5MMX10.0MM (Screw) ×3 IMPLANT
SCREW BN FT 16X2.3XLCK HEX CRT (Screw) IMPLANT
SCREW CORT FT 18X2.3XLCK HEX (Screw) IMPLANT
SCREW CORTICAL LOCKING 2.3X16M (Screw) ×2 IMPLANT
SCREW CORTICAL LOCKING 2.3X18M (Screw) ×10 IMPLANT
SCREW FX18X2.3XSMTH LCK NS CRT (Screw) IMPLANT
SCREW LOCK 3.5X8MM (Screw) ×1 IMPLANT
SLEEVE SCD COMPRESS KNEE MED (MISCELLANEOUS) ×1 IMPLANT
SPLINT PLASTER CAST XFAST 4X15 (CAST SUPPLIES) IMPLANT
SPLINT PLASTER XTRA FAST SET 4 (CAST SUPPLIES) ×10
SPONGE GAUZE 4X4 12PLY (GAUZE/BANDAGES/DRESSINGS) ×2 IMPLANT
STOCKINETTE 4X48 STRL (DRAPES) ×2 IMPLANT
SUCTION FRAZIER TIP 10 FR DISP (SUCTIONS) IMPLANT
SUT ETHILON 3 0 PS 1 (SUTURE) IMPLANT
SUT ETHILON 4 0 PS 2 18 (SUTURE) ×1 IMPLANT
SUT VIC AB 3-0 PS1 18 (SUTURE)
SUT VIC AB 3-0 PS1 18XBRD (SUTURE) IMPLANT
SUT VICRYL 4-0 PS2 18IN ABS (SUTURE) ×2 IMPLANT
SYR BULB 3OZ (MISCELLANEOUS) ×2 IMPLANT
SYR CONTROL 10ML LL (SYRINGE) IMPLANT
TOWEL OR 17X24 6PK STRL BLUE (TOWEL DISPOSABLE) ×3 IMPLANT
TUBE CONNECTING 20X1/4 (TUBING) IMPLANT
UNDERPAD 30X30 INCONTINENT (UNDERPADS AND DIAPERS) ×2 IMPLANT
WATER STERILE IRR 1000ML POUR (IV SOLUTION) ×1 IMPLANT

## 2011-11-29 NOTE — Anesthesia Postprocedure Evaluation (Signed)
  Anesthesia Post-op Note  Patient: Marie Gross  Procedure(s) Performed: Procedure(s) (LRB): OPEN REDUCTION INTERNAL FIXATION (ORIF) DISTAL RADIAL FRACTURE (Left)  Patient Location: PACU  Anesthesia Type: GA combined with regional for post-op pain  Level of Consciousness: awake, alert  and oriented  Airway and Oxygen Therapy: Patient Spontanous Breathing  Post-op Pain: none  Post-op Assessment: Post-op Vital signs reviewed, Patient's Cardiovascular Status Stable, Respiratory Function Stable, Patent Airway, No signs of Nausea or vomiting and Pain level controlled  Post-op Vital Signs: Reviewed and stable  Complications: No apparent anesthesia complications

## 2011-11-29 NOTE — Op Note (Signed)
Dictation [phone]

## 2011-11-29 NOTE — Op Note (Signed)
NAMEBRENDALY, TOWNSEL                  ACCOUNT NO.:  [id number]  MEDICAL RECORD NO.:  [id number]  LOCATION:                                 FACILITY:  PHYSICIAN:  Betha Loa, MD        DATE OF BIRTH:  08-Feb-1966  DATE OF PROCEDURE:  11/29/2011 DATE OF DISCHARGE:                              OPERATIVE REPORT   PREOPERATIVE DIAGNOSIS:  Left comminuted distal radius fracture.  POSTOPERATIVE DIAGNOSIS:  Left comminuted distal radius fracture.  PROCEDURE:  Open reduction and internal fixation of left comminuted distal radius fracture.  SURGEON:  Betha Loa, M.D.  ASSISTANTS:  None.  ANESTHESIA:  General and regional.  IV FLUIDS:  Per anesthesia flow sheet.  ESTIMATED BLOOD LOSS:  Minimal.  COMPLICATIONS:  None.  SPECIMENS:  None.  TOURNIQUET TIME:  70 minutes.  DISPOSITION:  Stable to PACU.  INDICATIONS:  Ms. Marie Gross is a 46 year old left-hand dominant female, who was running approximately 5 days ago when she fell onto her left hand. She had pain and deformity in the wrist.  She was seen at [facility], where radiographs were taken revealing a left distal radius fracture.  She was placed in a splint and follow up with me in the office.  On examination, she had intact sensation, capillary refill in all fingertips.  She can flex and extend the IP joint of thumb across fingers.  Her compartments were soft.  Review of her radiographs revealed a comminuted distal radius fracture without intra-articular extension.  I discussed with Ms. Hanf the nature of the injury.  We discussed nonoperative and operative treatment measures.  She elected operative fixation.  Risks, benefits, alternatives of surgery were discussed including the risk of blood loss, infection, damage to nerves, vessels, tendons, ligaments, bone, failure of surgery, need for additional surgery, complications with wound healing, continued pain, nonunion, malunion, and stiffness.  She voiced  understanding of these risks and elected to proceed.  OPERATIVE COURSE:  After being identified preoperatively by myself, the patient and I agreed upon the procedure and site of the procedure.  The surgical site was marked.  The risks, benefits, and alternatives of the surgery were reviewed and she wished to proceed.  Surgical consent had been signed.  She was given 1 g of IV vancomycin as preoperative antibiotic prophylaxis due to allergy to penicillins.  A regional block was performed by Anesthesia in preoperative holding.  She was transferred to the operating room and placed on the operating room table in a supine position with the left upper extremity on an arm board. General anesthesia was induced by the anesthesiologist.  Left upper extremity was prepped and draped in normal sterile orthopedic fashion. Surgical pause was performed between surgeons, anesthesia, operating staff, and all were in agreement on the patient, procedure, and site of the procedure.  A standard volar Sherilyn Cooter approach was used.  The superficial and deep portions of the FCR tendon sheath were incised. The bipolar electrocautery was used to obtain hemostasis.  The FCR and FPL were swept ulnarly to protect the palmar cutaneous branch of the median nerve.  The pronator was released from the radial side  of the radius and elevated using a periosteal elevator.  Fracture site was easily identified.  It was cleared of soft tissue interposition. Reduction was obtained under direct visualization after release of the brachioradialis.  The C-arm was used in AP, lateral, oblique projections to ensure appropriate reduction.  A narrow plate from the [employer] volar distal radial plate set was selected.  This was secured using the guide pins.  The C-arm was again used in AP, lateral, and oblique projections to ensure appropriate positioning of the hardware and reduction of fracture, which was the case.  A single screw was placed in  the slotted hole in the shaft of the plate.  This was done using standard AO drilling and measuring technique.  The holes in the distal aspect of the plate were then filled, again using standard AO drilling and measuring technique.  Smooth pegs were used in all, but the 2 radial styloid holes in which locking screws were placed.  The remaining holes in the shaft of the plate were then filled.  A locking screw to be used in the most proximal hole due to the short measurement of the screw length.  The C- arm was again used in AP, lateral, oblique projections to ensure appropriate reduction and positioning of the hardware, which was the case.  There was no intra-articular penetration.  The wound was copiously irrigated with sterile saline.  The pronator quadratus was repaired back over top of the plate using 4-0 Vicryl suture as best as possible.  There was no much tissue distally.  A single inverted interrupted Vicryl suture was placed in the subcutaneous tissues and skin was closed with 4-0 nylon in a horizontal mattress fashion.  The wound was dressed with sterile Xeroform, 4x4s, and wrapped with Kerlix. A volar splint was placed and wrapped with Kerlix and Ace bandage. Tourniquet was deflated at 70 minutes.  The fingertips were pink with brisk capillary refill after deflation of tourniquet.  Operative drapes were broken down.  The patient was awoken from anesthesia safely.  She was transferred back to stretcher and taken to PACU in stable condition. I will see her back in the office in 1 week for postoperative followup. I will give her Percocet 5/325, 1-2 p.o. q.6 hours p.r.n. pain and Zofran 4 mg p.o. q.8 hours p.r.n. nausea, dispensed #12.     Betha Loa, MD     KK/MEDQ  D:  11/29/2011  T:  11/29/2011  Job:  [id number]

## 2011-11-29 NOTE — Discharge Instructions (Addendum)
[facility] Instructions Hand Surgery  Wound Care: Keep your hand elevated above the level of your heart.  Do not allow it to dangle  by your side.  Keep the dressing dry and do not remove it unless your doctor advises you to do so.  He will usually change it at the time of your post-op visit.  Moving your fingers is advised to stimulate circulation but will depend on the site of your surgery.  If you have a splint applied, your doctor will advise you regarding movement.  Activity: Do not drive or operate machinery today.  Rest today and then you may return to your normal activity and work as indicated by your physician.  Diet:  Drink liquids today or eat a light diet.  You may resume a regular diet tomorrow.    General expectations: Pain for two to three days. Fingers may become slightly swollen.  Call your doctor if any of the following occur: Severe pain not relieved by pain medication. Elevated temperature. Dressing soaked with blood. Inability to move fingers. White or bluish color to fingers.  [facility]  [address] [place], [place] [address] [phone]   Post Anesthesia Home Care Instructions  Activity: Get plenty of rest for the remainder of the day. A responsible adult should stay with you for 24 hours following the procedure.  For the next 24 hours, DO NOT: -Drive a car -Operate machinery -Drink alcoholic beverages -Take any medication unless instructed by your physician -Make any legal decisions or sign important papers.  Meals: Start with liquid foods such as gelatin or soup. Progress to regular foods as tolerated. Avoid greasy, spicy, heavy foods. If nausea and/or vomiting occur, drink only clear liquids until the nausea and/or vomiting subsides. Call your physician if vomiting continues.  Special Instructions/Symptoms: Your throat may feel dry or sore from the anesthesia or the breathing tube placed in your throat during surgery. If  this causes discomfort, gargle with warm salt water. The discomfort should disappear within 24 hours.    Regional Anesthesia Blocks  1. Numbness or the inability to move the "blocked" extremity may last from 3-48 hours after placement. The length of time depends on the medication injected and your individual response to the medication. If the numbness is not going away after 48 hours, call your surgeon.  2. The extremity that is blocked will need to be protected until the numbness is gone and the  Strength has returned. Because you cannot feel it, you will need to take extra care to avoid injury. Because it may be weak, you may have difficulty moving it or using it. You may not know what position it is in without looking at it while the block is in effect.  3. For blocks in the legs and feet, returning to weight bearing and walking needs to be done carefully. You will need to wait until the numbness is entirely gone and the strength has returned. You should be able to move your leg and foot normally before you try and bear weight or walk. You will need someone to be with you when you first try to ensure you do not fall and possibly risk injury.  4. Bruising and tenderness at the needle site are common side effects and will resolve in a few days.  5. Persistent numbness or new problems with movement should be communicated to the surgeon or the [facility] ([phone]).  

## 2011-11-29 NOTE — Transfer of Care (Signed)
Immediate Anesthesia Transfer of Care Note  Patient: Marie Gross  Procedure(s) Performed: Procedure(s) (LRB): OPEN REDUCTION INTERNAL FIXATION (ORIF) DISTAL RADIAL FRACTURE (Left)  Patient Location: PACU  Anesthesia Type: General and Regional  Level of Consciousness: awake, oriented and patient cooperative  Airway & Oxygen Therapy: Patient Spontanous Breathing and Patient connected to face mask oxygen  Post-op Assessment: Report given to PACU RN  Post vital signs: Reviewed  Complications: No apparent anesthesia complications

## 2011-11-29 NOTE — Anesthesia Procedure Notes (Addendum)
Anesthesia Regional Block:  Axillary brachial plexus block  Pre-Anesthetic Checklist: ,, timeout performed, Correct Patient, Correct Site, Correct Laterality, Correct Procedure, Correct Position, site marked, Risks and benefits discussed,  Surgical consent,  Pre-op evaluation,  At surgeon's request and post-op pain management  Laterality: Left  Prep: chloraprep       Needles:  Injection technique: Single-shot  Needle Type: Other   (#22 ga Blunt "B" bevel block needle)    Needle Gauge: 22 and 22 G    Additional Needles:  Procedures: paresthesia technique Axillary brachial plexus block  Nerve Stimulator or Paresthesia:  Response: transient median paresthesia,  Response: transient radial paresthesia,   Additional Responses:   Narrative:  Start time: 11/29/2011 8:00 AM End time: 11/29/2011 8:08 AM Injection made incrementally with aspirations every 5 mL.  Events: blood aspirated  Performed by: Personally  Anesthesiologist: Sandford Craze, MD  Additional Notes: Pt identified in Holding room.  Monitors applied. Working IV access confirmed. Sterile prep L axilla.  #22ga "B" bevel to transient med, then radial paresthesiae..  Total 25cc 0.5% Bupivacaine with 1:200k epi injected, plus 10cc 1.5% lidocaine with 1:200k epi incrementally after negative test dose.  Patient asymptomatic, VSS, no heme aspirated after initial attempt, tolerated well.   Sandford Craze, MD  Axillary brachial plexus block Procedure Name: LMA Insertion Date/Time: 11/29/2011 9:04 AM Performed by: Jamillah Camilo D Pre-anesthesia Checklist: Patient identified, Emergency Drugs available, Suction available and Patient being monitored Patient Re-evaluated:Patient Re-evaluated prior to inductionOxygen Delivery Method: Circle System Utilized Preoxygenation: Pre-oxygenation with 100% oxygen Intubation Type: IV induction Ventilation: Mask ventilation without difficulty LMA: LMA inserted LMA Size: 4.0 Number of attempts:  1 Placement Confirmation: positive ETCO2 Tube secured with: Tape Dental Injury: Teeth and Oropharynx as per pre-operative assessment

## 2011-11-29 NOTE — Brief Op Note (Signed)
11/29/2011  10:43 AM  PATIENT:  Marie Gross  46 y.o. female  PRE-OPERATIVE DIAGNOSIS:  left distal radius fracture  POST-OPERATIVE DIAGNOSIS:  left distal radius fracture  PROCEDURE:  Procedure(s) (LRB): OPEN REDUCTION INTERNAL FIXATION (ORIF) DISTAL RADIAL FRACTURE (Left)  SURGEON:  Surgeon(s) and Role:    * Tami Ribas, MD - Primary  PHYSICIAN ASSISTANT:   ASSISTANTS: none   ANESTHESIA:   regional and general  EBL:  Total I/O In: 1500 [I.V.:1500] Out: -   BLOOD ADMINISTERED:none  DRAINS: none   LOCAL MEDICATIONS USED:  NONE  SPECIMEN:  No Specimen  DISPOSITION OF SPECIMEN:  N/A  COUNTS:  YES  TOURNIQUET:   Total Tourniquet Time Documented: Upper Arm (Left) - 70 minutes  DICTATION: .Other Dictation: Dictation Number [id number]  PLAN OF CARE: Discharge to home after PACU  PATIENT DISPOSITION:  PACU - hemodynamically stable.

## 2011-11-29 NOTE — Anesthesia Preprocedure Evaluation (Signed)
Anesthesia Evaluation  Patient identified by MRN, date of birth, ID band Patient awake    Reviewed: Allergy & Precautions, H&P , NPO status , Patient's Chart, lab work & pertinent test results  History of Anesthesia Complications (+) PONV  Airway Mallampati: II TM Distance: >3 FB Neck ROM: Full    Dental No notable dental hx. (+) Teeth Intact and Dental Advisory Given   Pulmonary neg pulmonary ROS,  clear to auscultation  Pulmonary exam normal       Cardiovascular neg cardio ROS Regular Normal    Neuro/Psych Negative Neurological ROS     GI/Hepatic negative GI ROS, Neg liver ROS,   Endo/Other  Negative Endocrine ROS  Renal/GU negative Renal ROS     Musculoskeletal   Abdominal   Peds  Hematology negative hematology ROS (+)   Anesthesia Other Findings   Reproductive/Obstetrics                           Anesthesia Physical Anesthesia Plan  ASA: I  Anesthesia Plan: General   Post-op Pain Management:    Induction: Intravenous  Airway Management Planned: LMA  Additional Equipment:   Intra-op Plan:   Post-operative Plan:   Informed Consent: I have reviewed the patients History and Physical, chart, labs and discussed the procedure including the risks, benefits and alternatives for the proposed anesthesia with the patient or authorized representative who has indicated his/her understanding and acceptance.   Dental advisory given  Plan Discussed with: CRNA and Surgeon  Anesthesia Plan Comments: (Plan routine monitors, GA- LMA OK, axillary block for post op analgesia)        Anesthesia Quick Evaluation

## 2011-11-29 NOTE — H&P (Signed)
Marie Gross is an 46 y.o. female.   Chief Complaint: left distal radius fracture HPI: 46 yo lhd female fell while running 11/23/11 onto left arm.  Pain and deformity noted in left wrist.  Seen at [facility] where XR revealed left distal radius fracture.  Reports no previous injuries and no other injuries at this time.  Past Medical History  Diagnosis Date  . Allergic rhinitis   . Headache   . History of nasal polyp   . Complication of anesthesia   . PONV (postoperative nausea and vomiting)     Past Surgical History  Procedure Date  . Cesarean section     x3  . Uterine artery embolization   . Nasal polyp excision     x2    Family History  Problem Relation Age of Onset  . Allergies Other   . Asthma Other   . Hypertension Other    Social History:  reports that she has never smoked. She does not have any smokeless tobacco history on file. She reports that she drinks alcohol. She reports that she does not use illicit drugs.  Allergies:  Allergies  Allergen Reactions  . Penicillins   . Sulfonamide Derivatives   . Tetracycline     Medications Prior to Admission  Medication Dose Route Frequency Provider Last Rate Last Dose  . chlorhexidine (HIBICLENS) 4 % liquid 4 application  60 mL Topical Once Tami Ribas, MD      . fentaNYL (SUBLIMAZE) injection 50-100 mcg  50-100 mcg Intravenous PRN Germaine Pomfret, MD   50 mcg at 11/29/11 0801  . lactated ringers infusion   Intravenous Continuous Constance Goltz, MD 10 mL/hr at 11/29/11 [phone]    . midazolam (VERSED) injection 1-2 mg  1-2 mg Intravenous PRN E. Jairo Ben, MD   1 mg at 11/29/11 0808  . scopolamine (TRANSDERM-SCOP) 1.5 MG 1.5 mg  1 patch Transdermal Q72H E. Jairo Ben, MD   1.5 mg at 11/29/11 0756   Medications Prior to Admission  Medication Sig Dispense Refill  . fluticasone (FLONASE) 50 MCG/ACT nasal spray Place 2 sprays into the nose daily as needed. For allergies.      . naproxen (NAPROSYN) 500 MG  tablet Take 1 tablet (500 mg total) by mouth 2 (two) times daily.  20 tablet  0  . norethindrone (JOLIVETTE) 0.35 MG tablet Take 1 tablet by mouth daily.      . ondansetron (ZOFRAN) 8 MG tablet Take 8 mg by mouth every 8 (eight) hours as needed.      Marland Kitchen oxyCODONE-acetaminophen (PERCOCET) 5-325 MG per tablet Take 1-2 tablets by mouth every 6 (six) hours as needed for pain.  20 tablet  0  . SUMAtriptan (IMITREX) 100 MG tablet Take 100 mg by mouth every 2 (two) hours as needed. For migraines.      Marland Kitchen EPINEPHrine (EPIPEN 2-PAK) 0.3 mg/0.3 mL DEVI Inject 0.3 mg into the muscle once. As needed for severe allergic reaction         Results for orders placed during the hospital encounter of 11/29/11 (from the past 48 hour(s))  POCT HEMOGLOBIN-HEMACUE     Status: Abnormal   Collection Time   11/29/11  8:21 AM      Component Value Range Comment   Hemoglobin 11.6 (*) 12.0 - 15.0 (g/dL)     No results found.   A comprehensive review of systems was negative except for: Neurological: positive for headaches  Blood pressure 118/63, pulse 96, temperature 98.3  F (36.8 C), temperature source Oral, resp. rate 16, height 5\' 3"  (1.6 m), weight 45.36 kg (100 lb), last menstrual period 10/31/2011, SpO2 100.00%.  General appearance: alert, cooperative and appears stated age Head: Normocephalic, without obvious abnormality, atraumatic Neck: supple, symmetrical, trachea midline Resp: clear to auscultation bilaterally Cardio: regular rate and rhythm GI: soft, non-tender; bowel sounds normal; no masses,  no organomegaly Extremities: light touch sensation and capillary refill intact all digits.  +epl/fpl/io.  ttp left wrist.  no other ttp.  right arm without ttp.  no wounds. Pulses: 2+ and symmetric Skin: Skin color, texture, turgor normal. No rashes or lesions Neurologic: Grossly normal Incision/Wound: na  Assessment/Plan Left distal radius fracture.  Discussed non operative and operative treatment options.   Patient elected surgical fixation.  Risks, benefits, and alternatives of surgery were discussed and the patient agrees with the plan of care.   Taron Conrey R 11/29/2011, 8:49 AM

## 2011-11-29 NOTE — Progress Notes (Signed)
Assisted Dr. Jackson with left, axillary block. Side rails up, monitors on throughout procedure. See vital signs in flow sheet. Tolerated Procedure well. 

## 2012-02-19 ENCOUNTER — Other Ambulatory Visit: Payer: Self-pay | Admitting: Family Medicine

## 2012-02-19 ENCOUNTER — Ambulatory Visit
Admission: RE | Admit: 2012-02-19 | Discharge: 2012-02-19 | Disposition: A | Discharge status: Home / Self Care | Payer: BC Managed Care – PPO | Source: Ambulatory Visit | Attending: Family Medicine | Admitting: Family Medicine

## 2012-02-19 DIAGNOSIS — R109 Unspecified abdominal pain: Secondary | ICD-10-CM

## 2012-02-19 MED ORDER — IOHEXOL 300 MG/ML  SOLN
40.0000 mL | Freq: Once | INTRAMUSCULAR | Status: AC | PRN
  Administered 2012-02-19: 40 mL via ORAL

## 2012-02-19 MED ORDER — IOHEXOL 300 MG/ML  SOLN
100.0000 mL | Freq: Once | INTRAMUSCULAR | Status: AC | PRN
  Administered 2012-02-19: 100 mL via INTRAVENOUS

## 2012-04-23 ENCOUNTER — Other Ambulatory Visit: Payer: Self-pay | Admitting: Obstetrics & Gynecology

## 2012-04-23 DIAGNOSIS — Z1231 Encounter for screening mammogram for malignant neoplasm of breast: Secondary | ICD-10-CM

## 2012-05-07 ENCOUNTER — Ambulatory Visit
Admission: RE | Admit: 2012-05-07 | Discharge: 2012-05-07 | Disposition: A | Discharge status: Home / Self Care | Payer: BC Managed Care – PPO | Source: Ambulatory Visit | Attending: Obstetrics & Gynecology | Admitting: Obstetrics & Gynecology

## 2012-05-07 DIAGNOSIS — Z1231 Encounter for screening mammogram for malignant neoplasm of breast: Secondary | ICD-10-CM

## 2012-07-08 DIAGNOSIS — Z202 Contact with and (suspected) exposure to infections with a predominantly sexual mode of transmission: Secondary | ICD-10-CM

## 2012-07-08 DIAGNOSIS — R87619 Unspecified abnormal cytological findings in specimens from cervix uteri: Secondary | ICD-10-CM

## 2012-07-08 DIAGNOSIS — IMO0002 Reserved for concepts with insufficient information to code with codable children: Secondary | ICD-10-CM

## 2012-07-08 HISTORY — DX: Contact with and (suspected) exposure to infections with a predominantly sexual mode of transmission: Z20.2

## 2012-07-08 HISTORY — DX: Unspecified abnormal cytological findings in specimens from cervix uteri: R87.619

## 2012-07-08 HISTORY — DX: Reserved for concepts with insufficient information to code with codable children: IMO0002

## 2012-08-20 HISTORY — PX: COLPOSCOPY: SHX161

## 2013-01-13 ENCOUNTER — Telehealth: Payer: Self-pay | Admitting: Certified Nurse Midwife

## 2013-01-13 NOTE — Telephone Encounter (Signed)
If you will write it I will sign for you

## 2013-01-13 NOTE — Telephone Encounter (Signed)
Spoke with pt who has recently changed her name. Needs Rx for her OCP with her new name, Marie Gross, sent to [facility] so it matches her new insurance card. Eunice Blase, how is the best way to do this? Write Rx on pad and fax with new name to [facility]? We will need to change her name in Epic before we can send electronically.   aa

## 2013-01-13 NOTE — Telephone Encounter (Signed)
This patient called and left a message during lunch to let us know that her name has changed to Marie Gross. She needs a new prescription with the correct name so her insurance card matches the prescription. Please call the patient.

## 2013-01-13 NOTE — Telephone Encounter (Signed)
OK to give new prescription.  I agree written one .

## 2013-01-14 ENCOUNTER — Other Ambulatory Visit: Payer: Self-pay | Admitting: Orthopedic Surgery

## 2013-01-15 ENCOUNTER — Telehealth: Payer: Self-pay | Admitting: Orthopedic Surgery

## 2013-01-15 NOTE — Telephone Encounter (Signed)
LM on pt's VM confirming name that new Rx was faxed to [facility] with new name on it.  aa

## 2013-01-22 ENCOUNTER — Other Ambulatory Visit: Payer: Self-pay | Admitting: *Deleted

## 2013-01-22 NOTE — Telephone Encounter (Signed)
Norenthindrone 0.35 mg with 6 refills  faxed to [facility] [phone]) 01/14/13 by Tory Emerald

## 2013-02-02 NOTE — Telephone Encounter (Signed)
RX written, signed and faxed in 01/15/13 phone note. Second request received from [facility].  RX denied as it has already been sent to [facility].

## 2013-05-08 ENCOUNTER — Other Ambulatory Visit: Payer: Self-pay | Admitting: Obstetrics & Gynecology

## 2013-05-08 DIAGNOSIS — Z0000 Encounter for general adult medical examination without abnormal findings: Secondary | ICD-10-CM

## 2013-05-18 ENCOUNTER — Ambulatory Visit (HOSPITAL_COMMUNITY)
Admission: RE | Admit: 2013-05-18 | Discharge: 2013-05-18 | Disposition: A | Discharge status: Home / Self Care | Payer: 59 | Source: Ambulatory Visit | Attending: Obstetrics & Gynecology | Admitting: Obstetrics & Gynecology

## 2013-05-18 DIAGNOSIS — Z0000 Encounter for general adult medical examination without abnormal findings: Secondary | ICD-10-CM

## 2013-05-18 DIAGNOSIS — Z1231 Encounter for screening mammogram for malignant neoplasm of breast: Secondary | ICD-10-CM | POA: Insufficient documentation

## 2013-06-16 ENCOUNTER — Telehealth: Payer: Self-pay | Admitting: Internal Medicine

## 2013-06-16 NOTE — Telephone Encounter (Signed)
FYI Pt. Last ordered vac. 10/12/10. Pt. Dropped off shots.

## 2013-07-31 ENCOUNTER — Encounter: Payer: Self-pay | Admitting: Obstetrics & Gynecology

## 2013-07-31 ENCOUNTER — Ambulatory Visit (INDEPENDENT_AMBULATORY_CARE_PROVIDER_SITE_OTHER): Payer: 59 | Admitting: Obstetrics & Gynecology

## 2013-07-31 VITALS — BP 112/76 | HR 78 | Resp 18 | Ht 63.0 in | Wt 103.0 lb

## 2013-07-31 DIAGNOSIS — Z01419 Encounter for gynecological examination (general) (routine) without abnormal findings: Secondary | ICD-10-CM

## 2013-07-31 DIAGNOSIS — Z0000 Encounter for general adult medical examination without abnormal findings: Secondary | ICD-10-CM

## 2013-07-31 DIAGNOSIS — Z124 Encounter for screening for malignant neoplasm of cervix: Secondary | ICD-10-CM

## 2013-07-31 LAB — POCT URINALYSIS DIPSTICK
Glucose, UA: NEGATIVE
Nitrite, UA: NEGATIVE
Protein, UA: NEGATIVE
Urobilinogen, UA: NEGATIVE

## 2013-07-31 LAB — HEMOGLOBIN, FINGERSTICK: Hemoglobin, fingerstick: 13.3 g/dL (ref 12.0–16.0)

## 2013-07-31 MED ORDER — NORETHINDRONE 0.35 MG PO TABS: 1.0000 | ORAL_TABLET | Freq: Every day | ORAL | Status: DC

## 2013-07-31 MED ORDER — SUMATRIPTAN SUCCINATE 100 MG PO TABS: 100.0000 mg | ORAL_TABLET | ORAL | Status: DC | PRN

## 2013-07-31 NOTE — Progress Notes (Signed)
47 y.o. G3P3 Divorced Caucasian F here for annual exam.  Three children are 16, 18, and 20.  Older two are at [employer].  Both will go on to 4 year colleges.  Having a lot more headaches this past year.  H/o migraines with aura.  Using the Imitrex but usually take about 10 a year.  Has been to [facility].  Amenorrhea on POP.  Dating same person for almost two years.    Patient's last menstrual period was 10/08/2012.          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 She has never used smokeless tobacco. She reports that she does not drink alcohol or use illicit drugs.  Past Medical History  Diagnosis Date  . Allergic rhinitis   . Headache(784.0)   . History of nasal polyp   . Complication of anesthesia   . PONV (postoperative nausea and vomiting)   . 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 Hx of migraines     with Aura  . Abnormal Pap smear 07/2012    Asc [place] + HPV    Past Surgical History  Procedure Laterality Date  . Uterine artery embolization    . Nasal polyp excision      x2  . Breast cyst excision  1998    Benign  . Colposcopy  08/20/12    CIN 1 +HPV  . Cesarean section      x3  . Wrist surgery Left 2013    Current Outpatient Prescriptions  Medication Sig Dispense Refill  . cetirizine (ZYRTEC) 10 MG tablet Take 10 mg by mouth as needed.      Marland Kitchen EPINEPHrine (EPIPEN 2-PAK) 0.3 mg/0.3 mL DEVI Inject 0.3 mg into the muscle once. As needed for severe allergic reaction       . fluticasone (FLONASE) 50 MCG/ACT nasal spray Place 2 sprays into the nose daily as needed. For allergies.      Marland Kitchen norethindrone (JOLIVETTE) 0.35 MG tablet Take 1 tablet by  mouth daily.      . ondansetron (ZOFRAN) 8 MG tablet Take 8 mg by mouth every 8 (eight) hours as needed.      . SUMAtriptan (IMITREX) 100 MG tablet Take 100 mg by mouth every 2 (two) hours as needed. For migraines.       No current facility-administered medications for this visit.    Family History  Problem Relation Age of Onset  . Allergies Other   . Asthma Other   . Hypertension Other   . Hyperlipidemia Mother   . Hypertension Father   . Diabetes Father   . Cancer Father     prostate and skin cancer    ROS:  Pertinent items are noted in HPI.  Otherwise, a comprehensive ROS was negative.  Exam:   BP 112/76  Pulse 78  Resp 18  Ht 5\' 3"  (1.6 m)  Wt 103 lb (46.72 kg)  BMI 18.25 kg/m2  LMP 10/08/2012  Weight change: stable   Height: 5\' 3"  (160 cm)  Ht Readings from Last 3 Encounters:  07/31/13 5\' 3"  (1.6 m)  11/29/11 5'  3" (1.6 m)  11/29/11 5\' 3"  (1.6 m)    General appearance: alert, cooperative and appears stated age Head: Normocephalic, without obvious abnormality, atraumatic Neck: no adenopathy, supple, symmetrical, trachea midline and thyroid normal to inspection and palpation Lungs: clear to auscultation bilaterally Breasts: normal appearance, no masses or tenderness Heart: regular rate and rhythm Abdomen: soft, non-tender; bowel sounds normal; no masses,  no organomegaly Extremities: extremities normal, atraumatic, no cyanosis or edema Skin: Skin color, texture, turgor normal. No rashes or lesions Lymph nodes: Cervical, supraclavicular, and axillary nodes normal. No abnormal inguinal nodes palpated Neurologic: Grossly normal   Pelvic: External genitalia:  no lesions              Urethra:  normal appearing urethra with no masses, tenderness or lesions              Bartholins and Skenes: normal                 Vagina: normal appearing vagina with normal color and discharge, no lesions              Cervix: no lesions              Pap taken: yes Bimanual Exam:   Uterus:  normal size, contour, position, consistency, mobility, non-tender except for 3cm anterior right fibroid              Adnexa: normal adnexa and no mass, fullness, tenderness               Rectovaginal: Confirms               Anus:  normal sphincter tone, no lesions  A:  Well Woman with normal exam On POP Migraines with aura H/O CIN 1 3cm anterior fibroid  P:   mammogram pap smear Continue Jolivette.  Rx to [facility]. Imitrex 100mg  at headache onset.  Repeat in 2 hours as needed.  #9/12 CMP, TSH, Vit D, HBA1C, Lipids return annually or prn  An After Visit Summary was printed and given to the patient.

## 2013-07-31 NOTE — Patient Instructions (Signed)

## 2013-08-01 LAB — COMPREHENSIVE METABOLIC PANEL
AST: 14 U/L (ref 0–37)
BUN: 8 mg/dL (ref 6–23)
Calcium: 9.4 mg/dL (ref 8.4–10.5)
Chloride: 103 mEq/L (ref 96–112)
Creat: 0.66 mg/dL (ref 0.50–1.10)
Glucose, Bld: 87 mg/dL (ref 70–99)
Sodium: 139 mEq/L (ref 135–145)
Total Protein: 7.2 g/dL (ref 6.0–8.3)

## 2013-08-01 LAB — LIPID PANEL
LDL Cholesterol: 89 mg/dL (ref 0–99)
VLDL: 14 mg/dL (ref 0–40)

## 2013-08-01 LAB — HEMOGLOBIN A1C: Hgb A1c MFr Bld: 5.4 % (ref <5.7)

## 2013-08-01 LAB — VITAMIN D 25 HYDROXY (VIT D DEFICIENCY, FRACTURES): Vit D, 25-Hydroxy: 32 ng/mL (ref 30–89)

## 2013-08-04 LAB — IPS PAP SMEAR ONLY

## 2013-08-10 ENCOUNTER — Telehealth: Payer: Self-pay

## 2013-08-10 NOTE — Telephone Encounter (Signed)
Called patient back, not able to leave a message//kn

## 2013-08-10 NOTE — Telephone Encounter (Signed)
Lmtcb//kn 

## 2013-08-10 NOTE — Telephone Encounter (Signed)
Patient returning Kelly's call. °

## 2013-08-10 NOTE — Telephone Encounter (Signed)
Message copied by Elisha Headland on Mon Aug 10, 2013 12:38 PM ------      Message from: Jerene Bears      Created: Wed Aug 05, 2013 10:51 AM       Inform hba1c, tsh, lipids, cmp, normal.  Pap 02 recall.  Vit d 32.  Take 1000 iu daily. ------

## 2013-09-08 NOTE — Telephone Encounter (Signed)
Patient aware of all results. 

## 2014-03-30 ENCOUNTER — Other Ambulatory Visit: Payer: Self-pay

## 2014-03-30 DIAGNOSIS — Z1231 Encounter for screening mammogram for malignant neoplasm of breast: Secondary | ICD-10-CM

## 2014-05-17 ENCOUNTER — Ambulatory Visit: Payer: 59

## 2014-05-19 ENCOUNTER — Ambulatory Visit: Payer: 59

## 2014-05-25 ENCOUNTER — Ambulatory Visit
Admission: RE | Admit: 2014-05-25 | Discharge: 2014-05-25 | Disposition: A | Discharge status: Home / Self Care | Payer: 59 | Source: Ambulatory Visit

## 2014-05-25 DIAGNOSIS — Z1231 Encounter for screening mammogram for malignant neoplasm of breast: Secondary | ICD-10-CM

## 2014-08-04 ENCOUNTER — Ambulatory Visit (INDEPENDENT_AMBULATORY_CARE_PROVIDER_SITE_OTHER): Payer: 59 | Admitting: Certified Nurse Midwife

## 2014-08-04 ENCOUNTER — Encounter: Payer: Self-pay | Admitting: Certified Nurse Midwife

## 2014-08-04 VITALS — BP 116/60 | HR 68 | Resp 16 | Ht 63.0 in | Wt 106.0 lb

## 2014-08-04 DIAGNOSIS — Z0000 Encounter for general adult medical examination without abnormal findings: Secondary | ICD-10-CM

## 2014-08-04 DIAGNOSIS — G43109 Migraine with aura, not intractable, without status migrainosus: Secondary | ICD-10-CM

## 2014-08-04 DIAGNOSIS — Z01419 Encounter for gynecological examination (general) (routine) without abnormal findings: Secondary | ICD-10-CM

## 2014-08-04 DIAGNOSIS — Z124 Encounter for screening for malignant neoplasm of cervix: Secondary | ICD-10-CM

## 2014-08-04 LAB — POCT URINALYSIS DIPSTICK
Bilirubin, UA: NEGATIVE
Blood, UA: NEGATIVE
GLUCOSE UA: NEGATIVE
Ketones, UA: NEGATIVE
LEUKOCYTES UA: NEGATIVE
NITRITE UA: NEGATIVE
Protein, UA: NEGATIVE
Urobilinogen, UA: NEGATIVE
pH, UA: 5

## 2014-08-04 LAB — HEMOGLOBIN A1C
HEMOGLOBIN A1C: 5.2 % (ref <5.7)
MEAN PLASMA GLUCOSE: 103 mg/dL (ref <117)

## 2014-08-04 LAB — HEMOGLOBIN, FINGERSTICK: Hemoglobin, fingerstick: 12.3 g/dL (ref 12.0–16.0)

## 2014-08-04 MED ORDER — SUMATRIPTAN SUCCINATE 100 MG PO TABS: 100.0000 mg | ORAL_TABLET | ORAL | Status: DC | PRN

## 2014-08-04 NOTE — Patient Instructions (Signed)

## 2014-08-04 NOTE — Progress Notes (Signed)
48 y.o. G3P3 Divorced Caucasian Fe here for annual exam. Periods none with Micronor, has stopped Micronor now.  Would like to be off now to see if menopausal, sister had early menopause. Denies vaginal dryness,hot flashes, or night sweats. Patient's migraine occurrence has decreased. Now using OTC treatment for some migraines. Sees PCP prn. No health issues today, would like Hgb A1-c lab due to strong family history.   Patient's last menstrual period was 10/08/2012.          Sexually active: No.  The current method of family planning is abstinence Exercising: Yes.    yoga & walking Smoker:  no  Health Maintenance: Pap: 08-01-23 neg MMG: 05-25-14 density category c, birads category 1:neg Colonoscopy:  none BMD:   none TDaP:  12/07 Labs: Poct urine-, Hgb-12.3 Self breast exam: done occ   reports that she has never smoked. She has never used smokeless tobacco. She reports that she does not drink alcohol or use illicit drugs.  Past Medical History  Diagnosis Date  . Allergic rhinitis   . Headache(784.0)   . History of nasal polyp   . Complication of anesthesia   . PONV (postoperative nausea and vomiting)   . 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 Hx of migraines     with Aura  . Abnormal Pap smear 07/2012    Asc [place] + HPV    Past Surgical History  Procedure Laterality Date  . Uterine artery embolization  9/09  . Nasal polyp excision      x2  . Breast cyst excision  1998    Benign  . Colposcopy  08/20/12    CIN 1 +HPV  . Cesarean section  94, 95, 98    x3  . Wrist surgery Left 2013    Current Outpatient Prescriptions  Medication Sig Dispense Refill  . cetirizine (ZYRTEC) 10 MG tablet Take 10 mg by mouth as needed.      . fluticasone (FLONASE) 50 MCG/ACT nasal spray Place 2 sprays into the nose daily as needed. For allergies.      . SUMAtriptan (IMITREX) 100 MG tablet Take 1 tablet (100 mg total) by mouth every 2 (two) hours as needed. For migraines.  9 tablet  12   No current  facility-administered medications for this visit.    Family History  Problem Relation Age of Onset  . Hyperlipidemia Mother   . Parkinson's disease Mother   . Hypertension Father   . Diabetes Father   . Cancer Father     prostate and skin cancer  . Diabetes Maternal Grandmother   . Hypertension Maternal Grandmother   . Cancer Maternal Grandfather     renal  . Gout Maternal Grandfather   . Renal cancer Maternal Grandfather   . Hypertension Paternal Grandmother   . Hypertension Paternal Grandfather   . Heart attack Paternal Grandfather 50    ROS:  Pertinent items are noted in HPI.  Otherwise, a comprehensive ROS was negative.  Exam:   BP 116/60  Pulse 68  Resp 16  Ht 5\' 3"  (1.6 m)  Wt 106 lb (48.081 kg)  BMI 18.78 kg/m2  LMP 10/08/2012 Height: 5\' 3"  (160 cm)  Ht Readings from Last 3 Encounters:  08/04/14 5\' 3"  (1.6 m)  07/31/13 5\' 3"  (1.6 m)  11/29/11 5\' 3"  (1.6 m)    General appearance: alert, cooperative and appears stated age Head: Normocephalic, without obvious abnormality, atraumatic Neck: no adenopathy, supple, symmetrical, trachea midline and thyroid normal to inspection and palpation  Lungs: clear to auscultation bilaterally Breasts: normal appearance, no masses or tenderness, No nipple retraction or dimpling, No nipple discharge or bleeding, No axillary or supraclavicular adenopathy Heart: regular rate and rhythm Abdomen: soft, non-tender; no masses,  no organomegaly Extremities: extremities normal, atraumatic, no cyanosis or edema Skin: Skin color, texture, turgor normal. No rashes or lesions Lymph nodes: Cervical, supraclavicular, and axillary nodes normal. No abnormal inguinal nodes palpated Neurologic: Grossly normal   Pelvic: External genitalia:  no lesions              Urethra:  normal appearing urethra with no masses, tenderness or lesions              Bartholin's and Skene's: normal                 Vagina: normal appearing vagina with normal color  and discharge, no lesions              Cervix: normal, non tender no lesions              Pap taken: Yes.   Bimanual Exam:  Uterus:  normal size, contour, position, consistency, mobility, non-tender and mid position              Adnexa: normal adnexa and no mass, fullness, tenderness               Rectovaginal: Confirms               Anus:  normal sphincter tone, no lesions  A:  Well Woman with normal exam  Contraception none now, previous POP  Migraine headache with aura Imitrex works well , but has been also using OTC prn  Screening Lab  P:   Reviewed health and wellness pertinent to exam   Rx Imitrex see order, Patient will advise if problems with medication.  Lab Hgb A-1c  Pap smear taken today with HPVHR   counseled on breast self exam, mammography screening, adequate intake of calcium and vitamin D, diet and exercise  return annually or prn  An After Visit Summary was printed and given to the patient.

## 2014-08-05 NOTE — Progress Notes (Signed)
Reviewed personally.  M. Suzanne [place] Dube, MD.  

## 2014-08-06 LAB — IPS PAP TEST WITH HPV

## 2014-08-09 ENCOUNTER — Encounter: Payer: Self-pay | Admitting: Certified Nurse Midwife

## 2015-05-23 ENCOUNTER — Other Ambulatory Visit: Payer: Self-pay

## 2015-05-23 DIAGNOSIS — Z1231 Encounter for screening mammogram for malignant neoplasm of breast: Secondary | ICD-10-CM

## 2015-06-30 ENCOUNTER — Ambulatory Visit: Payer: 59

## 2015-07-12 ENCOUNTER — Ambulatory Visit
Admission: RE | Admit: 2015-07-12 | Discharge: 2015-07-12 | Disposition: A | Discharge status: Home / Self Care | Payer: 59 | Source: Ambulatory Visit

## 2015-07-12 DIAGNOSIS — Z1231 Encounter for screening mammogram for malignant neoplasm of breast: Secondary | ICD-10-CM

## 2015-07-13 ENCOUNTER — Other Ambulatory Visit: Payer: Self-pay | Admitting: Obstetrics & Gynecology

## 2015-07-13 DIAGNOSIS — R928 Other abnormal and inconclusive findings on diagnostic imaging of breast: Secondary | ICD-10-CM

## 2015-07-20 ENCOUNTER — Other Ambulatory Visit: Payer: Self-pay | Admitting: Obstetrics & Gynecology

## 2015-07-20 ENCOUNTER — Ambulatory Visit
Admission: RE | Admit: 2015-07-20 | Discharge: 2015-07-20 | Disposition: A | Discharge status: Home / Self Care | Payer: 59 | Source: Ambulatory Visit | Attending: Obstetrics & Gynecology | Admitting: Obstetrics & Gynecology

## 2015-07-20 DIAGNOSIS — R928 Other abnormal and inconclusive findings on diagnostic imaging of breast: Secondary | ICD-10-CM

## 2015-07-22 ENCOUNTER — Ambulatory Visit
Admission: RE | Admit: 2015-07-22 | Discharge: 2015-07-22 | Disposition: A | Discharge status: Home / Self Care | Payer: 59 | Source: Ambulatory Visit | Attending: Obstetrics & Gynecology | Admitting: Obstetrics & Gynecology

## 2015-07-22 DIAGNOSIS — R928 Other abnormal and inconclusive findings on diagnostic imaging of breast: Secondary | ICD-10-CM

## 2015-08-12 ENCOUNTER — Ambulatory Visit (INDEPENDENT_AMBULATORY_CARE_PROVIDER_SITE_OTHER): Payer: 59 | Admitting: Certified Nurse Midwife

## 2015-08-12 ENCOUNTER — Encounter: Payer: Self-pay | Admitting: Certified Nurse Midwife

## 2015-08-12 VITALS — BP 98/62 | HR 72 | Resp 16 | Ht 62.75 in | Wt 105.0 lb

## 2015-08-12 DIAGNOSIS — N912 Amenorrhea, unspecified: Secondary | ICD-10-CM

## 2015-08-12 DIAGNOSIS — Z01419 Encounter for gynecological examination (general) (routine) without abnormal findings: Secondary | ICD-10-CM | POA: Diagnosis not present

## 2015-08-12 DIAGNOSIS — N951 Menopausal and female climacteric states: Secondary | ICD-10-CM | POA: Diagnosis not present

## 2015-08-12 DIAGNOSIS — Z1211 Encounter for screening for malignant neoplasm of colon: Secondary | ICD-10-CM

## 2015-08-12 DIAGNOSIS — Z0000 Encounter for general adult medical examination without abnormal findings: Secondary | ICD-10-CM

## 2015-08-12 LAB — POCT URINALYSIS DIPSTICK
Bilirubin, UA: NEGATIVE
Blood, UA: NEGATIVE
GLUCOSE UA: NEGATIVE
KETONES UA: NEGATIVE
Leukocytes, UA: NEGATIVE
Nitrite, UA: NEGATIVE
PROTEIN UA: NEGATIVE
Urobilinogen, UA: NEGATIVE
pH, UA: 5

## 2015-08-12 LAB — COMPREHENSIVE METABOLIC PANEL
ALBUMIN: 3.9 g/dL (ref 3.6–5.1)
ALK PHOS: 44 U/L (ref 33–130)
ALT: 9 U/L (ref 6–29)
AST: 15 U/L (ref 10–35)
BUN: 10 mg/dL (ref 7–25)
CO2: 24 mmol/L (ref 20–31)
CREATININE: 0.54 mg/dL (ref 0.50–1.05)
Calcium: 8.6 mg/dL (ref 8.6–10.4)
Chloride: 103 mmol/L (ref 98–110)
Glucose, Bld: 88 mg/dL (ref 65–99)
POTASSIUM: 3.8 mmol/L (ref 3.5–5.3)
Sodium: 138 mmol/L (ref 135–146)
TOTAL PROTEIN: 6.2 g/dL (ref 6.1–8.1)
Total Bilirubin: 0.6 mg/dL (ref 0.2–1.2)

## 2015-08-12 LAB — HCG, SERUM, QUALITATIVE: PREG SERUM: NEGATIVE

## 2015-08-12 LAB — LIPID PANEL
Cholesterol: 188 mg/dL (ref 125–200)
HDL: 81 mg/dL (ref >=46)
LDL CALC: 97 mg/dL (ref <130)
TRIGLYCERIDES: 48 mg/dL (ref <150)
Total CHOL/HDL Ratio: 2.3 Ratio (ref <=5.0)
VLDL: 10 mg/dL (ref <30)

## 2015-08-12 LAB — HEMOGLOBIN A1C
HEMOGLOBIN A1C: 5.6 % (ref <5.7)
MEAN PLASMA GLUCOSE: 114 mg/dL (ref <117)

## 2015-08-12 LAB — FOLLICLE STIMULATING HORMONE: FSH: 56.7 m[IU]/mL

## 2015-08-12 LAB — TSH: TSH: 2.404 u[IU]/mL (ref 0.350–4.500)

## 2015-08-12 LAB — HEMOGLOBIN, FINGERSTICK: Hemoglobin, fingerstick: 11.8 g/dL — ABNORMAL LOW (ref 12.0–16.0)

## 2015-08-12 NOTE — Patient Instructions (Addendum)
EXERCISE AND DIET:  We recommended that you start or continue a regular exercise program for good health. Regular exercise means any activity that makes your heart beat faster and makes you sweat.  We recommend exercising at least 30 minutes per day at least 3 days a week, preferably 4 or 5.  We also recommend a diet low in fat and sugar.  Inactivity, poor dietary choices and obesity can cause diabetes, heart attack, stroke, and kidney damage, among others.    ALCOHOL AND SMOKING:  Women should limit their alcohol intake to no more than 7 drinks/beers/glasses of wine (combined, not each!) per week. Moderation of alcohol intake to this level decreases your risk of breast cancer and liver damage. And of course, no recreational drugs are part of a healthy lifestyle.  And absolutely no smoking or even second hand smoke. Most people know smoking can cause heart and lung diseases, but did you know it also contributes to weakening of your bones? Aging of your skin?  Yellowing of your teeth and nails?  CALCIUM AND VITAMIN D:  Adequate intake of calcium and Vitamin D are recommended.  The recommendations for exact amounts of these supplements seem to change often, but generally speaking 600 mg of calcium (either carbonate or citrate) and 800 units of Vitamin D per day seems prudent. Certain women may benefit from higher intake of Vitamin D.  If you are among these women, your doctor will have told you during your visit.    PAP SMEARS:  Pap smears, to check for cervical cancer or precancers,  have traditionally been done yearly, although recent scientific advances have shown that most women can have pap smears less often.  However, every woman still should have a physical exam from her gynecologist every year. It will include a breast check, inspection of the vulva and vagina to check for abnormal growths or skin changes, a visual exam of the cervix, and then an exam to evaluate the size and shape of the uterus and  ovaries.  And after 50 years of age, a rectal exam is indicated to check for rectal cancers. We will also provide age appropriate advice regarding health maintenance, like when you should have certain vaccines, screening for sexually transmitted diseases, bone density testing, colonoscopy, mammograms, etc.   MAMMOGRAMS:  All women over 40 years old should have a yearly mammogram. Many facilities now offer a "3D" mammogram, which may cost around $50 extra out of pocket. If possible,  we recommend you accept the option to have the 3D mammogram performed.  It both reduces the number of women who will be called back for extra views which then turn out to be normal, and it is better than the routine mammogram at detecting truly abnormal areas.    COLONOSCOPY:  Colonoscopy to screen for colon cancer is recommended for all women at age 50.  We know, you hate the idea of the prep.  We agree, BUT, having colon cancer and not knowing it is worse!!  Colon cancer so often starts as a polyp that can be seen and removed at colonscopy, which can quite literally save your life!  And if your first colonoscopy is normal and you have no family history of colon cancer, most women don't have to have it again for 10 years.  Once every ten years, you can do something that may end up saving your life, right?  We will be happy to help you get it scheduled when you are ready.    Be sure to check your insurance coverage so you understand how much it will cost.  It may be covered as a preventative service at no cost, but you should check your particular policy.     Perimenopause Perimenopause is the time when your body begins to move into the menopause (no menstrual period for 12 straight months). It is a natural process. Perimenopause can begin 2-8 years before the menopause and usually lasts for 1 year after the menopause. During this time, your ovaries may or may not produce an egg. The ovaries vary in their production of estrogen and  progesterone hormones each month. This can cause irregular menstrual periods, difficulty getting pregnant, vaginal bleeding between periods, and uncomfortable symptoms. CAUSES  Irregular production of the ovarian hormones, estrogen and progesterone, and not ovulating every month.  Other causes include:  Tumor of the pituitary gland in the brain.  Medical disease that affects the ovaries.  Radiation treatment.  Chemotherapy.  Unknown causes.  Heavy smoking and excessive alcohol intake can bring on perimenopause sooner. SIGNS AND SYMPTOMS   Hot flashes.  Night sweats.  Irregular menstrual periods.  Decreased sex drive.  Vaginal dryness.  Headaches.  Mood swings.  Depression.  Memory problems.  Irritability.  Tiredness.  Weight gain.  Trouble getting pregnant.  The beginning of losing bone cells (osteoporosis).  The beginning of hardening of the arteries (atherosclerosis). DIAGNOSIS  Your health care provider will make a diagnosis by analyzing your age, menstrual history, and symptoms. He or she will do a physical exam and note any changes in your body, especially your female organs. Female hormone tests may or may not be helpful depending on the amount of female hormones you produce and when you produce them. However, other hormone tests may be helpful to rule out other problems. TREATMENT  In some cases, no treatment is needed. The decision on whether treatment is necessary during the perimenopause should be made by you and your health care provider based on how the symptoms are affecting you and your lifestyle. Various treatments are available, such as:  Treating individual symptoms with a specific medicine for that symptom.  Herbal medicines that can help specific symptoms.  Counseling.  Group therapy. HOME CARE INSTRUCTIONS   Keep track of your menstrual periods (when they occur, how heavy they are, how long between periods, and how long they last) as  well as your symptoms and when they started.  Only take over-the-counter or prescription medicines as directed by your health care provider.  Sleep and rest.  Exercise.  Eat a diet that contains calcium (good for your bones) and soy (acts like the estrogen hormone).  Do not smoke.  Avoid alcoholic beverages.  Take vitamin supplements as recommended by your health care provider. Taking vitamin E may help in certain cases.  Take calcium and vitamin D supplements to help prevent bone loss.  Group therapy is sometimes helpful.  Acupuncture may help in some cases. SEEK MEDICAL CARE IF:   You have questions about any symptoms you are having.  You need a referral to a specialist (gynecologist, psychiatrist, or psychologist). SEEK IMMEDIATE MEDICAL CARE IF:   You have vaginal bleeding.  Your period lasts longer than 8 days.  Your periods are recurring sooner than 21 days.  You have bleeding after intercourse.  You have severe depression.  You have pain when you urinate.  You have severe headaches.  You have vision problems.   This information is not intended to replace advice   given to you by your health care provider. Make sure you discuss any questions you have with your health care provider.   Document Released: 11/01/2004 Document Revised: 10/15/2014 Document Reviewed: 04/23/2013 Elsevier Interactive Patient Education 2016 [place] for iron source

## 2015-08-12 NOTE — Progress Notes (Signed)
49 y.o. G3P3 Divorced  Caucasian Fe here for annual exam. Periods none at this time, stopped more than 6 months ago, no bleeding since May 2016. Occasional hot flash. No night sweats. Recently had breast biopsy(07/22/15) for lesion noted in right breast on screening mammogram which was benign fibrocystic changes. She is very relieved all normal and is on regular yearly screening again. Would like to have screening labs today. Not sexually active in last year. Sees PCP prn. 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 Using Magnesium nasal spray for Migraines and works well!   No LMP recorded.          Sexually active: No.  The current method of family planning is abstinence.    Exercising: Yes.    yoga & hiking Smoker:  no  Health Maintenance: Pap:  08-04-14 neg HPV HR neg MMG:  07-12-15 category c, f/u 07-22-15 biopsy done rt breast calcifications Colonoscopy:  BMD:   none TDaP:  12/07 Labs: poct urine-neg, hgb-11.8 Self breast exam: done monthly   reports that she has never smoked. She has never used smokeless tobacco. She reports that she does not drink alcohol or use illicit drugs.  Past Medical History  Diagnosis Date  . Allergic rhinitis   . Headache(784.0)   . History of nasal polyp   . Complication of anesthesia   . PONV (postoperative nausea and vomiting)   . 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 Hx of migraines     with Aura  . Abnormal Pap smear 07/2012    Asc [place] + HPV    Past Surgical History  Procedure Laterality Date  . Uterine artery embolization  9/09  . Nasal polyp excision      x2  . Breast cyst excision  1998    Benign  . Colposcopy  08/20/12    CIN 1 +HPV  . Cesarean section  94, 95, 98    x3  . Wrist surgery Left 2013    Current Outpatient Prescriptions  Medication Sig Dispense Refill  . cetirizine (ZYRTEC) 10 MG tablet Take 10 mg by mouth as needed.    . mometasone (NASONEX) 50 MCG/ACT nasal spray Place  2 sprays into the nose daily.    . SUMAtriptan (IMITREX) 100 MG tablet Take 1 tablet (100 mg total) by mouth every 2 (two) hours as needed. For migraines. 9 tablet 12   No current facility-administered medications for this visit.    Family History  Problem Relation Age of Onset  . Hyperlipidemia Mother   . Parkinson's disease Mother   . Hypertension Father   . Diabetes Father   . Cancer Father     prostate and skin cancer  . Diabetes Maternal Grandmother   . Hypertension Maternal Grandmother   . Cancer Maternal Grandfather     renal  . Gout Maternal Grandfather   . Renal cancer Maternal Grandfather   . Hypertension Paternal Grandmother   . Hypertension Paternal Grandfather   . Heart attack Paternal Grandfather 50    ROS:  Pertinent items are noted in HPI.  Otherwise, a comprehensive ROS was negative.  Exam:   BP 98/62 mmHg  Pulse 72  Resp 16  Ht 5' 2.75" (1.594 m)  Wt 105 lb (47.628 kg)  BMI 18.75 kg/m2  LMP  Height: 5' 2.75" (159.4 cm) Ht Readings from Last 3 Encounters:  08/12/15 5' 2.75" (1.594 m)  08/04/14 5\' 3"  (1.6 m)  07/31/13  5\' 3"  (1.6 m)    General appearance: alert, cooperative and appears stated age Head: Normocephalic, without obvious abnormality, atraumatic Neck: no adenopathy, supple, symmetrical, trachea midline and thyroid normal to inspection and palpation Lungs: clear to auscultation bilaterally Breasts: normal appearance, no masses or tenderness, No nipple retraction or dimpling, No nipple discharge or bleeding, No axillary or supraclavicular adenopathy, area of biopsy on UOQ of right breast is healing well. Heart: regular rate and rhythm Abdomen: soft, non-tender; no masses,  no organomegaly Extremities: extremities normal, atraumatic, no cyanosis or edema Skin: Skin color, texture, turgor normal. No rashes or lesions Lymph nodes: Cervical, supraclavicular, and axillary nodes normal. No abnormal inguinal nodes palpated Neurologic: Grossly  normal   Pelvic: External genitalia:  no lesions              Urethra:  normal appearing urethra with no masses, tenderness or lesions              Bartholin's and Skene's: normal                 Vagina: normal appearing vagina with normal color and discharge, no lesions              Cervix: normal, appearance, non tender, no lesions              Pap taken: No. Bimanual Exam:  Uterus:  normal size, contour, position, consistency, mobility, non-tender              Adnexa: normal adnexa and no mass, fullness, tenderness               Rectovaginal: Confirms               Anus:  normal sphincter tone, no lesions  Chaperone present: yes  A:  Well Woman with normal exam  Contraception abstinence  Perimenopausal with amenorrhea  Right breast biopsy with benign fibrocystic changes only, yearly screening  Screening labs  Colonoscopy due    P:   Reviewed health and wellness pertinent to exam  Discussed etiology of perimenopause/menopause and expectations with cycle. Questions addressed. Will do [place] today and advise if Provera challenge indicated. Patient will advise if no bleeding in next 6 months will be officially menopausal and if bleeding would need evaluation for PMB. Voiced understanding.  Lab: [facility]  Stressed SBE and mammogram follow up one year  Labs: Lipid panel, CMP, TSH,Vitamin D,HCG qual.  Discussed risks and benefits of colonoscopy, patient requests referral for. Will be referred to Dr. Collene Mares and patient will be called with referral information.  Pap smear as above not taken  counseled on breast self exam, mammography screening, adequate intake of calcium and vitamin D, diet and exercise  return annually or prn  An After Visit Summary was printed and given to the patient.

## 2015-08-13 LAB — VITAMIN D 25 HYDROXY (VIT D DEFICIENCY, FRACTURES): Vit D, 25-Hydroxy: 28 ng/mL — ABNORMAL LOW (ref 30–100)

## 2015-08-17 NOTE — Progress Notes (Signed)
Encounter reviewed Avryl Roehm, MD   

## 2015-08-22 ENCOUNTER — Telehealth: Payer: Self-pay | Admitting: Certified Nurse Midwife

## 2015-08-22 NOTE — Telephone Encounter (Signed)
Call to patient. She had annual exam 08/12/15 with Melvia Heaps CNM.  [place] on 08/12/15 was 56.7.  Last vaginal bleeding was approximately 6 months ago per patient.  On Friday night 08/19/15 she began to have feelings of pelvic cramping, and then started having vaginal bleeding. Patient describes bleeding Saturday and Sunday "like a period." Today, spotting only.   Advised will review with Dr. Sabra Heck and return call with instructions.

## 2015-08-22 NOTE — Telephone Encounter (Signed)
Patient was told she was in menopause and should no longer have a cycle. Patient started her cycle over the weekend. Patient is asking to talk with a nurse. Last seen 08/12/15.

## 2015-08-22 NOTE — Telephone Encounter (Signed)
With [facility] [place] of 56, she shouldn't be bleeding.  I would recommend OV and repeat [place] with possible endometrial biopsy.  Will discuss and decide when she comes.  Do not need to precert this.

## 2015-08-23 NOTE — Telephone Encounter (Signed)
Spoke with patient. She states bleeding has stopped. Discussed message from Dr. Sabra Heck and patient agreeable. Office visit 08/25/15 at 1115 with Dr. Sabra Heck. Routing to provider for final review. Patient agreeable to disposition. Will close encounter.

## 2015-08-25 ENCOUNTER — Ambulatory Visit (INDEPENDENT_AMBULATORY_CARE_PROVIDER_SITE_OTHER): Payer: 59 | Admitting: Obstetrics & Gynecology

## 2015-08-25 ENCOUNTER — Ambulatory Visit (INDEPENDENT_AMBULATORY_CARE_PROVIDER_SITE_OTHER): Payer: 59

## 2015-08-25 ENCOUNTER — Encounter: Payer: Self-pay | Admitting: Obstetrics & Gynecology

## 2015-08-25 VITALS — BP 108/68 | HR 72 | Resp 16 | Wt 103.0 lb

## 2015-08-25 DIAGNOSIS — Z124 Encounter for screening for malignant neoplasm of cervix: Secondary | ICD-10-CM

## 2015-08-25 DIAGNOSIS — N950 Postmenopausal bleeding: Secondary | ICD-10-CM

## 2015-08-25 DIAGNOSIS — D251 Intramural leiomyoma of uterus: Secondary | ICD-10-CM

## 2015-08-25 DIAGNOSIS — N870 Mild cervical dysplasia: Secondary | ICD-10-CM

## 2015-08-25 DIAGNOSIS — R1900 Intra-abdominal and pelvic swelling, mass and lump, unspecified site: Secondary | ICD-10-CM

## 2015-08-25 NOTE — Progress Notes (Addendum)
Subjective:     Patient ID: Marie Gross, female   DOB: 11/13/1965, 49 y.o.   MRN: [id number]  HPI 49 yo G3P3 MWF here for complaint of PMP bleeding.  Pt hasn't had a cycle in about six months.  [place] was elevated at 56 on 08/12/15 at [facility] with Debbi Hollice Espy.  On 08/19/15, pt started what felt like a normal full 7 day cycle.  Pt has not had hot flashes or night sweats.  She didn't really have any PMS symptoms before the bleeding, either.  Reports she has a small amount of low and slightly left pain pain/discomfort.  Denies bladder or bowel changes.  Pt has hx of 5cm intramural fibroid treated with [place] in 2009.    Review of Systems  All other systems reviewed and are negative.      Objective:   Physical Exam  Constitutional: She is oriented to person, place, and time. She appears well-developed and well-nourished.  Abdominal: Soft. Bowel sounds are normal. She exhibits no distension and no mass. There is no tenderness. There is no rebound and no guarding.  Genitourinary: Vagina normal. There is no rash, tenderness, lesion or injury on the right labia. There is no rash, tenderness, lesion or injury on the left labia. Cervix exhibits no motion tenderness. Right adnexum displays no mass, no tenderness and no fullness. Left adnexum displays no mass, no tenderness and no fullness.  Uterus small with possible fibroid vs smooth and mobile ovarian mass noted.  Lymphadenopathy:       Right: No inguinal adenopathy present.       Left: No inguinal adenopathy present.  Neurological: She is alert and oriented to person, place, and time.  Skin: Skin is warm and dry.  Psychiatric: She has a normal mood and affect.   Ultrasound recommended. Uterus 9.3 x 4.9 x 4.2cm with 2.5 x 2.9 x 2.4cm calcified fundal but intramural fibroid and 1.4 x 1.0cm fibroid off to pt's left Endometrium 4.11mm Left ovary 2.4 x 2.1 x 0000000 with 0000000 follicle Right ovary 1.6 x 1.0 x 0.7cm  Endometrial biopsy recommended.  Discussed  with patient.  Verbal and written consent obtained.   Procedure:  Speculum placed.  Cervix visualized and Pap obtained.  Then cervix was cleansed with betadine prep.  A single toothed tenaculum was applied to the anterior lip of the cervix.  Endometrial pipelle was advanced through the cervix into the endometrial cavity without difficulty.  Pipelle passed to 7cm.  Suction applied and pipelle removed with good tissue sample obtained.  Tenculum removed.  No bleeding noted.  Patient tolerated procedure well.     Assessment:     PMP bleeding Uterine fibroids with h/o [place] and 1.4cm calcified fibroid     Plan:     Endometrial biopsy pending Pap smear pending  If findings are benign, will just watch conservatively for now.

## 2015-08-28 ENCOUNTER — Encounter: Payer: Self-pay | Admitting: Obstetrics & Gynecology

## 2015-08-28 DIAGNOSIS — D251 Intramural leiomyoma of uterus: Secondary | ICD-10-CM | POA: Insufficient documentation

## 2015-08-29 ENCOUNTER — Telehealth: Payer: Self-pay

## 2015-08-29 LAB — IPS PAP TEST WITH HPV

## 2015-08-29 NOTE — Telephone Encounter (Signed)
-----   Message from Megan Salon, MD sent at 08/28/2015 10:18 PM EST ----- Please inform pt endometrial biopsy was negative for abnormal cells.  Is she still bleeding?  Pap has not resulted yet.

## 2015-08-29 NOTE — Telephone Encounter (Signed)
Lmtcb//kn 

## 2015-08-29 NOTE — Telephone Encounter (Signed)
Patient notified of results-see result note in epic.//kn

## 2015-08-30 ENCOUNTER — Other Ambulatory Visit: Payer: 59

## 2015-10-14 ENCOUNTER — Other Ambulatory Visit: Payer: Self-pay | Admitting: Certified Nurse Midwife

## 2015-10-14 NOTE — Telephone Encounter (Signed)
Medication refill request: Imitrex Last AEX:  08/12/15 DL Next AEX: Not scheduled Last MMG (if hormonal medication request): 07/22/15 Biopsy Breast, right, needle core biopsy, lateral - FIBROCYSTIC CHANGES WITH CALCIFICATIONS. - THERE IS NO EVIDENCE OF MALIGNANCY. Refill authorized: 08/04/14 Imitrex 100 mg #9 tabs 12 Refills  Refill authorized: Please advise

## 2016-02-24 DIAGNOSIS — G43909 Migraine, unspecified, not intractable, without status migrainosus: Secondary | ICD-10-CM | POA: Diagnosis not present

## 2016-02-24 DIAGNOSIS — J309 Allergic rhinitis, unspecified: Secondary | ICD-10-CM | POA: Diagnosis not present

## 2016-03-08 DIAGNOSIS — G43909 Migraine, unspecified, not intractable, without status migrainosus: Secondary | ICD-10-CM | POA: Diagnosis not present

## 2016-03-08 DIAGNOSIS — E559 Vitamin D deficiency, unspecified: Secondary | ICD-10-CM | POA: Diagnosis not present

## 2016-03-08 DIAGNOSIS — R739 Hyperglycemia, unspecified: Secondary | ICD-10-CM | POA: Diagnosis not present

## 2016-03-08 DIAGNOSIS — Z1211 Encounter for screening for malignant neoplasm of colon: Secondary | ICD-10-CM | POA: Diagnosis not present

## 2016-03-08 DIAGNOSIS — Z0000 Encounter for general adult medical examination without abnormal findings: Secondary | ICD-10-CM | POA: Diagnosis not present

## 2016-04-26 DIAGNOSIS — G43909 Migraine, unspecified, not intractable, without status migrainosus: Secondary | ICD-10-CM | POA: Diagnosis not present

## 2016-04-26 MED FILL — VENLAFAXINE HCL ER 37.5 MG: 37.5 | 30 days supply | Qty: 30 | Fill #0

## 2016-05-25 MED FILL — VENLAFAXINE HCL ER 37.5 MG: 37.5 | 90 days supply | Qty: 90 | Fill #0

## 2016-06-21 MED FILL — SUMATRIPTAN SUCC 100 MG TAB: 100 | 30 days supply | Qty: 9 | Fill #0

## 2016-07-24 MED FILL — SUMATRIPTAN SUCC 100 MG TAB: 100 | 30 days supply | Qty: 9 | Fill #1

## 2016-07-25 ENCOUNTER — Emergency Department (HOSPITAL_COMMUNITY)
Admission: EM | Admit: 2016-07-25 | Discharge: 2016-07-25 | Disposition: A | Discharge status: Home / Self Care | Payer: 59 | Attending: Emergency Medicine | Admitting: Emergency Medicine

## 2016-07-25 ENCOUNTER — Encounter (HOSPITAL_COMMUNITY): Payer: Self-pay | Admitting: Emergency Medicine

## 2016-07-25 DIAGNOSIS — S0185XA Open bite of other part of head, initial encounter: Secondary | ICD-10-CM | POA: Insufficient documentation

## 2016-07-25 DIAGNOSIS — S0121XA Laceration without foreign body of nose, initial encounter: Secondary | ICD-10-CM | POA: Diagnosis not present

## 2016-07-25 DIAGNOSIS — Y939 Activity, unspecified: Secondary | ICD-10-CM | POA: Diagnosis not present

## 2016-07-25 DIAGNOSIS — W540XXA Bitten by dog, initial encounter: Secondary | ICD-10-CM | POA: Diagnosis not present

## 2016-07-25 DIAGNOSIS — S0181XA Laceration without foreign body of other part of head, initial encounter: Secondary | ICD-10-CM

## 2016-07-25 DIAGNOSIS — Y999 Unspecified external cause status: Secondary | ICD-10-CM | POA: Diagnosis not present

## 2016-07-25 DIAGNOSIS — Y92009 Unspecified place in unspecified non-institutional (private) residence as the place of occurrence of the external cause: Secondary | ICD-10-CM | POA: Diagnosis not present

## 2016-07-25 DIAGNOSIS — Z23 Encounter for immunization: Secondary | ICD-10-CM | POA: Diagnosis not present

## 2016-07-25 MED ORDER — RABIES VACCINE, PCEC IM SUSR
1.0000 mL | Freq: Once | INTRAMUSCULAR | Status: AC
  Administered 2016-07-25: 1 mL via INTRAMUSCULAR
  Filled 2016-07-25: qty 1

## 2016-07-25 MED ORDER — RABIES IMMUNE GLOBULIN 150 UNIT/ML IM INJ
20.0000 [IU]/kg | INJECTION | Freq: Once | INTRAMUSCULAR | Status: AC
  Administered 2016-07-25: 900 [IU] via INTRAMUSCULAR
  Filled 2016-07-25: qty 6

## 2016-07-25 MED ORDER — CLINDAMYCIN HCL 300 MG PO CAPS: 300.0000 mg | ORAL_CAPSULE | Freq: Three times a day (TID) | ORAL | 0 refills | Status: AC

## 2016-07-25 MED ORDER — LIDOCAINE HCL (PF) 1 % IJ SOLN
10.0000 mL | Freq: Once | INTRAMUSCULAR | Status: AC
  Administered 2016-07-25: 10 mL
  Filled 2016-07-25: qty 10

## 2016-07-25 MED ORDER — CLINDAMYCIN HCL 150 MG PO CAPS
300.0000 mg | ORAL_CAPSULE | Freq: Once | ORAL | Status: AC
  Administered 2016-07-25: 300 mg via ORAL
  Filled 2016-07-25: qty 2

## 2016-07-25 NOTE — ED Provider Notes (Signed)
[facility] DEPT Provider Note   CSN: [id number] Arrival date & time: 07/25/16  1735     History   Chief Complaint Chief Complaint  Patient presents with  . Animal Bite    HPI Marie Gross is a 50 y.o. female.  HPI   Patient is a 50 year old female with a history of migraines who presents the emergency department after a dog bite that occurred roughly 1 hour PTA. Patient states she was trying to stop her daughter's boyfriend's dog from running away when the dog who is roughly 80 pounds jumped up and bit her in the face. She is a wound to her nose and her chin. She did not hit her head, the dog did not knock her over, she denies loss of consciousness. She denies visual changes. She states her wounds are currently numb she is not expressing any pain. Patient denies fever, nausea, vomiting, dizziness, syncope, headache, visual changes.  Past Medical History:  Diagnosis Date  . Abnormal Pap smear 07/2012   ASCUS + HPV, colpo with CIN1  . Allergic rhinitis   . Complication of anesthesia   . Headache(784.0)   . History of nasal polyp   . 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 Hx of migraines    with Aura  . PONV (postoperative nausea and vomiting)     Patient Active Problem List   Diagnosis Date Noted  . Intramural leiomyoma of uterus 08/28/2015  . MIGRAINE, CHRONIC 07/18/2007  . NASAL POLYP 07/18/2007  . ALLERGIC RHINITIS 07/18/2007  . HEADACHE 07/18/2007  . NASAL POLYPECTOMY, HX OF 07/18/2007    Past Surgical History:  Procedure Laterality Date  . BREAST BIOPSY Right    2016  . BREAST CYST EXCISION  1998   Benign  . CESAREAN SECTION  94, 95, 98   x3  . COLPOSCOPY  08/20/12   CIN 1 +HPV  . NASAL POLYP EXCISION     x2  . UTERINE ARTERY EMBOLIZATION  9/09  . WRIST SURGERY Left 2013    OB History    Gravida Para Term Preterm AB Living   3 3       3    SAB TAB Ectopic Multiple Live Births                   Home Medications    Prior to Admission medications     Medication Sig Start Date End Date Taking? Authorizing Provider  Cholecalciferol (VITAMIN D3) 10000 units TABS Take 1 tablet by mouth daily.   Yes Historical Provider, MD  venlafaxine XR (EFFEXOR-XR) 150 MG 24 hr capsule Take 150 mg by mouth daily with breakfast.   Yes Historical Provider, MD  clindamycin (CLEOCIN) 300 MG capsule Take 1 capsule (300 mg total) by mouth 3 (three) times daily. 07/25/16 08/01/16  Kalman Drape, PA  SUMAtriptan (IMITREX) 100 MG tablet TAKE 1 TABLET BY MOUTH EVERY 2 HOURS AS NEEDED FOR MIGRAINES Patient not taking: Reported on 07/25/2016 10/14/15   Regina Eck, CNM    Family History Family History  Problem Relation Age of Onset  . Hyperlipidemia Mother   . Parkinson's disease Mother   . Hypertension Father   . Diabetes Father   . Cancer Father     prostate and skin cancer  . Coronary artery disease Father   . Hyperlipidemia Father   . Diabetes Maternal Grandmother   . Hypertension Maternal Grandmother   . Cancer Maternal Grandfather     renal  . Gout Maternal Grandfather   .  Renal cancer Maternal Grandfather   . Hypertension Paternal Grandmother   . Hypertension Paternal Grandfather   . Heart attack Paternal Grandfather 93    Social History Social History  Substance Use Topics  . Smoking status: Never Smoker  . Smokeless tobacco: Never Used  . Alcohol use No     Allergies   Penicillins; Sulfonamide derivatives; and Tetracycline   Review of Systems Review of Systems  Constitutional: Negative for chills and fever.  HENT: Negative for facial swelling and nosebleeds.   Eyes: Negative for visual disturbance.  Gastrointestinal: Negative for nausea and vomiting.  Skin: Positive for wound.     Physical Exam Updated Vital Signs BP 140/95 (BP Location: Left Arm)   Pulse 91   Temp 98 F (36.7 C) (Oral)   Resp 18   Ht 5\' 3"  (1.6 m)   Wt 44.9 kg   LMP 10/08/2012   SpO2 100%   BMI 17.54 kg/m   Physical Exam  Constitutional: She  appears well-developed and well-nourished. No distress.  HENT:  Head: Normocephalic and atraumatic.  Nose: Nose lacerations present. No rhinorrhea. No epistaxis.  No foreign bodies.  Small <1cm laceration noted to right lower chin, flap wound noted to bridge of nose (please see photo)  Eyes: Conjunctivae are normal. Pupils are equal, round, and reactive to light.  Neck: Normal range of motion. Neck supple.  Pulmonary/Chest: Effort normal. No respiratory distress.  Musculoskeletal: Normal range of motion.  Neurological: She is alert. Coordination normal.  Skin: Skin is warm and dry. She is not diaphoretic.  Psychiatric: She has a normal mood and affect. Her behavior is normal.  Nursing note and vitals reviewed.      ED Treatments / Results  Labs (all labs ordered are listed, but only abnormal results are displayed) Labs Reviewed - No data to display  EKG  EKG Interpretation None       Radiology No results found.  Procedures .Marland KitchenLaceration Repair Date/Time: 07/25/2016 9:56 PM Performed by: Claris Gower, Amorina Doerr L Authorized by: Jackson Latino L   Consent:    Consent obtained:  Verbal   Consent given by:  Patient   Risks discussed:  Infection, poor cosmetic result, poor wound healing and pain Anesthesia (see MAR for exact dosages):    Anesthesia method:  Local infiltration   Local anesthetic:  Lidocaine 1% w/o epi Laceration details:    Location:  Face   Face location:  Nose   Length (cm):  2.5 Repair type:    Repair type:  Simple Pre-procedure details:    Preparation:  Patient was prepped and draped in usual sterile fashion Exploration:    Wound exploration: entire depth of wound probed and visualized   Treatment:    Area cleansed with:  Betadine   Amount of cleaning:  Extensive   Irrigation solution:  Sterile water   Irrigation volume:  2L   Irrigation method:  Syringe and pressure wash Skin repair:    Repair method:  Sutures   Suture size:  6-0   Suture  material:  Prolene   Number of sutures:  6 Approximation:    Approximation:  Close Post-procedure details:    Dressing:  Antibiotic ointment and non-adherent dressing   Patient tolerance of procedure:  Tolerated well, no immediate complications   (including critical care time)  Medications Ordered in ED Medications  rabies immune globulin (HYPERAB) injection 900 Units (900 Units Intramuscular Given 07/25/16 2145)  rabies vaccine (RABAVERT) injection 1 mL (1 mL Intramuscular Given 07/25/16 2145)  lidocaine (PF) (XYLOCAINE) 1 % injection 10 mL (10 mLs Infiltration Given by Other 07/25/16 2148)  clindamycin (CLEOCIN) capsule 300 mg (300 mg Oral Given 07/25/16 2151)     Initial Impression / Assessment and Plan / ED Course  I have reviewed the triage vital signs and the nursing notes.  Pertinent labs & imaging results that were available during my care of the patient were reviewed by me and considered in my medical decision making (see chart for details).  Clinical Course   Patient with dog bite to her face. Laceration occurred less than 8 hours prior to repair. Patient has no comorbidities to prevent wound healing. Patient is allergic to penicillin so patient will be discharged on clindamycin. Consulted ENT and spoke with Dr. Iran Planas who suggested clindamycin and for the patient to call her office in the morning to schedule appointment this week for follow-up. Discussed proper home wound care. The pt is unsure of the dogs rabies vaccine so the pt was given IGG and first does of rabies vaccine in the ED. Discussed second and third doses. Pt states her TDap is up to date less than 5 years ago. Pt is to f/u with Dr. Iran Planas in the morning. Discussed strict return precautions to the ED to include sign of infection. Patient expressed understanding to the discharge instructions.  Discussed the patient seen by Dr. Ellender Hose who agrees with the above plan.    Final Clinical Impressions(s) / ED  Diagnoses   Final diagnoses:  Dog bite of face, initial encounter  Facial laceration, initial encounter    New Prescriptions New Prescriptions   CLINDAMYCIN (CLEOCIN) 300 MG CAPSULE    Take 1 capsule (300 mg total) by mouth 3 (three) times daily.     Kalman Drape, PA 07/27/16 1522    Duffy Bruce, MD 07/27/16 Dorthula Perfect

## 2016-07-25 NOTE — ED Notes (Signed)
Pt is in stable condition upon d/c and ambulates from ED. 

## 2016-07-25 NOTE — Discharge Instructions (Signed)
Keep your wound clean and dry. Apply antibiotic ointment and keep your wound covered with a sterile bandage. Take the clindamycin as prescribed to be sure to complete the entire course. Call Dr. Para Skeans office tomorrow to schedule an appointment to be seen this week.   Return immediately to the emergency department if you experience signs of infection to include pain, swelling, redness, red streaks, foul discharge, fever, or any other concerning symptoms.

## 2016-07-25 NOTE — ED Triage Notes (Signed)
Pt states she was bitten by a friends dog at her home. Pt states she was trying to stop the dog from going by her and the dog, boxer lab mix, freaked out and bit her in the face. Pt has controlled bleeding to her nose. Dog is not up to date on its shots.

## 2016-07-26 MED FILL — CLINDAMYCIN HCL 300 MG CAPS: 300 | 7 days supply | Qty: 21 | Fill #0

## 2016-07-27 ENCOUNTER — Ambulatory Visit (HOSPITAL_COMMUNITY): Admission: EM | Admit: 2016-07-27 | Discharge: 2016-07-27 | Discharge status: Against Medical Advice | Payer: 59

## 2016-07-28 ENCOUNTER — Encounter (HOSPITAL_COMMUNITY): Payer: Self-pay | Admitting: Emergency Medicine

## 2016-07-28 ENCOUNTER — Ambulatory Visit (HOSPITAL_COMMUNITY)
Admission: EM | Admit: 2016-07-28 | Discharge: 2016-07-28 | Disposition: A | Discharge status: Home / Self Care | Payer: 59 | Attending: Radiology | Admitting: Radiology

## 2016-07-28 DIAGNOSIS — Z203 Contact with and (suspected) exposure to rabies: Secondary | ICD-10-CM

## 2016-07-28 MED ORDER — RABIES VACCINE, PCEC IM SUSR
1.0000 mL | Freq: Once | INTRAMUSCULAR | Status: AC
  Administered 2016-07-28: 1 mL via INTRAMUSCULAR

## 2016-07-28 MED ORDER — RABIES VACCINE, PCEC IM SUSR
INTRAMUSCULAR | Status: AC
Start: 2016-07-28 — End: 2016-07-28
  Filled 2016-07-28: qty 1

## 2016-07-28 NOTE — Discharge Instructions (Signed)
Please return to the Urgent [facility] on 07/31/2016 for your Day 7 rabies injection.

## 2016-07-28 NOTE — ED Triage Notes (Signed)
The patient presented to the [facility] to receive her Day 3 injection of the rabies series.

## 2016-07-31 ENCOUNTER — Other Ambulatory Visit: Payer: Self-pay | Admitting: Physical Medicine and Rehabilitation

## 2016-07-31 ENCOUNTER — Ambulatory Visit (HOSPITAL_COMMUNITY)
Admission: EM | Admit: 2016-07-31 | Discharge: 2016-07-31 | Disposition: A | Discharge status: Home / Self Care | Payer: 59 | Attending: Internal Medicine | Admitting: Internal Medicine

## 2016-07-31 ENCOUNTER — Encounter (HOSPITAL_COMMUNITY): Payer: Self-pay | Admitting: Emergency Medicine

## 2016-07-31 DIAGNOSIS — W540XXA Bitten by dog, initial encounter: Secondary | ICD-10-CM | POA: Diagnosis not present

## 2016-07-31 DIAGNOSIS — S0125XA Open bite of nose, initial encounter: Secondary | ICD-10-CM | POA: Diagnosis not present

## 2016-07-31 DIAGNOSIS — S0121XA Laceration without foreign body of nose, initial encounter: Secondary | ICD-10-CM | POA: Diagnosis not present

## 2016-07-31 DIAGNOSIS — Z203 Contact with and (suspected) exposure to rabies: Secondary | ICD-10-CM | POA: Diagnosis not present

## 2016-07-31 DIAGNOSIS — Z1231 Encounter for screening mammogram for malignant neoplasm of breast: Secondary | ICD-10-CM

## 2016-07-31 MED ORDER — RABIES VACCINE, PCEC IM SUSR
INTRAMUSCULAR | Status: AC
Start: 2016-07-31 — End: 2016-07-31
  Filled 2016-07-31: qty 1

## 2016-07-31 MED ORDER — RABIES VACCINE, PCEC IM SUSR
1.0000 mL | Freq: Once | INTRAMUSCULAR | Status: AC
  Administered 2016-07-31: 1 mL via INTRAMUSCULAR

## 2016-07-31 NOTE — Discharge Instructions (Signed)
Return as instructed for last rabies injection

## 2016-07-31 NOTE — ED Triage Notes (Signed)
Patient has had exposure to animal on 10/18.  Patient has been returning for rabies series.  Today is day #7 in series.

## 2016-08-08 ENCOUNTER — Encounter (HOSPITAL_COMMUNITY): Payer: Self-pay | Admitting: Emergency Medicine

## 2016-08-08 ENCOUNTER — Ambulatory Visit (HOSPITAL_COMMUNITY)
Admission: EM | Admit: 2016-08-08 | Discharge: 2016-08-08 | Disposition: A | Discharge status: Home / Self Care | Payer: 59 | Attending: Emergency Medicine | Admitting: Emergency Medicine

## 2016-08-08 DIAGNOSIS — Z203 Contact with and (suspected) exposure to rabies: Secondary | ICD-10-CM

## 2016-08-08 MED ORDER — RABIES VACCINE, PCEC IM SUSR
1.0000 mL | Freq: Once | INTRAMUSCULAR | Status: AC
  Administered 2016-08-08: 1 mL via INTRAMUSCULAR

## 2016-08-08 MED ORDER — RABIES VACCINE, PCEC IM SUSR
INTRAMUSCULAR | Status: AC
  Filled 2016-08-08: qty 1

## 2016-08-08 NOTE — ED Triage Notes (Signed)
This is the last day in rabies series for this patient.  Patient had animal bite on 10/18

## 2016-08-16 ENCOUNTER — Encounter: Payer: Self-pay | Admitting: Certified Nurse Midwife

## 2016-08-16 ENCOUNTER — Ambulatory Visit (INDEPENDENT_AMBULATORY_CARE_PROVIDER_SITE_OTHER): Payer: 59 | Admitting: Certified Nurse Midwife

## 2016-08-16 VITALS — BP 110/70 | HR 70 | Resp 16 | Ht 62.75 in | Wt 101.0 lb

## 2016-08-16 DIAGNOSIS — E559 Vitamin D deficiency, unspecified: Secondary | ICD-10-CM

## 2016-08-16 DIAGNOSIS — Z0000 Encounter for general adult medical examination without abnormal findings: Secondary | ICD-10-CM

## 2016-08-16 DIAGNOSIS — Z01419 Encounter for gynecological examination (general) (routine) without abnormal findings: Secondary | ICD-10-CM

## 2016-08-16 LAB — POCT URINALYSIS DIPSTICK
Bilirubin, UA: NEGATIVE
GLUCOSE UA: NEGATIVE
KETONES UA: NEGATIVE
Leukocytes, UA: NEGATIVE
Nitrite, UA: NEGATIVE
PROTEIN UA: NEGATIVE
RBC UA: NEGATIVE
UROBILINOGEN UA: NEGATIVE
pH, UA: 5

## 2016-08-16 NOTE — Patient Instructions (Signed)

## 2016-08-16 NOTE — Progress Notes (Signed)
50 y.o. G3P3 Divorced  Caucasian Fe here for annual exam. Menopausal  No HRT. Denies vaginal bleeding or vaginal dryness. Recent dog bite who was not vaccinated, so just finished rabies injection. Healing now without sequelae. Sees Dr. Harrington Challenger PCP for aex, migraines being treated with Effexor  which is working well.. Busy year, will be through with masters degree soon. Now working inpatient diabetes management. No other health issues today.  Patient's last menstrual period was 10/08/2012.          Sexually active: No.  The current method of family planning is post menopausal status.    Exercising: Yes.    hike, aerobics & yoga Smoker:  no  Health Maintenance: Pap:  08-25-15 neg HPV HR neg MMG:  10/16  Needle biopsy Colonoscopy:  12/16 f/u 48yrs BMD:   Long time ago TDaP:  2017 Shingles: no Pneumonia: no Hep C and HIV: not done Labs: poct urine-neg, hgb-11.9 Self breast exam: done occ.   reports that she has never smoked. She has never used smokeless tobacco. She reports that she does not drink alcohol or use drugs.  Past Medical History:  Diagnosis Date  . Abnormal Pap smear 07/2012   ASCUS + HPV, colpo with CIN1  . Allergic rhinitis   . Complication of anesthesia   . Headache(784.0)   . History of nasal polyp   . 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 Hx of migraines    with Aura  . PONV (postoperative nausea and vomiting)     Past Surgical History:  Procedure Laterality Date  . BREAST BIOPSY Right    2016  . BREAST CYST EXCISION  1998   Benign  . CESAREAN SECTION  94, 95, 98   x3  . COLPOSCOPY  08/20/12   CIN 1 +HPV  . NASAL POLYP EXCISION     x2  . UTERINE ARTERY EMBOLIZATION  9/09  . WRIST SURGERY Left 2013    Current Outpatient Prescriptions  Medication Sig Dispense Refill  . Cholecalciferol (VITAMIN D3) 10000 units TABS Take 1 tablet by mouth daily.    . SUMAtriptan (IMITREX) 100 MG tablet TAKE 1 TABLET BY MOUTH EVERY 2 HOURS AS NEEDED FOR MIGRAINES 9 tablet 3  .  venlafaxine (EFFEXOR) 37.5 MG tablet Take 37.5 mg by mouth 2 (two) times daily.     No current facility-administered medications for this visit.     Family History  Problem Relation Age of Onset  . Hyperlipidemia Mother   . Parkinson's disease Mother   . Skin cancer Mother   . Hypertension Father   . Diabetes Father   . Cancer Father     prostate and skin cancer  . Coronary artery disease Father   . Hyperlipidemia Father   . Diabetes Maternal Grandmother   . Hypertension Maternal Grandmother   . Cancer Maternal Grandfather     renal  . Gout Maternal Grandfather   . Renal cancer Maternal Grandfather   . Hypertension Paternal Grandmother   . Hypertension Paternal Grandfather   . Heart attack Paternal Grandfather 50    ROS:  Pertinent items are noted in HPI.  Otherwise, a comprehensive ROS was negative.  Exam:   BP 110/70   Pulse 70   Resp 16   Ht 5' 2.75" (1.594 m)   Wt 101 lb (45.8 kg)   LMP 10/08/2012   BMI 18.03 kg/m  Height: 5' 2.75" (159.4 cm) Ht Readings from Last 3 Encounters:  08/16/16 5' 2.75" (1.594 m)  07/25/16 5'  3" (1.6 m)  08/12/15 5' 2.75" (1.594 m)    General appearance: alert, cooperative and appears stated age Head: Normocephalic, without obvious abnormality, atraumatic Neck: no adenopathy, supple, symmetrical, trachea midline and thyroid normal to inspection and palpation Lungs: clear to auscultation bilaterally Breasts: normal appearance, no masses or tenderness, No nipple retraction or dimpling, No nipple discharge or bleeding, No axillary or supraclavicular adenopathy Heart: regular rate and rhythm Abdomen: soft, non-tender; no masses,  no organomegaly Extremities: extremities normal, atraumatic, no cyanosis or edema Skin: Skin color, texture, turgor normal. No rashes or lesions Lymph nodes: Cervical, supraclavicular, and axillary nodes normal. No abnormal inguinal nodes palpated Neurologic: Grossly normal   Pelvic: External genitalia:  no  lesions              Urethra:  normal appearing urethra with no masses, tenderness or lesions              Bartholin's and Skene's: normal                 Vagina: normal appearing vagina with normal color and discharge, no lesions              Cervix: no cervical motion tenderness, no lesions and normal appearance              Pap taken: yes Bimanual Exam:  Uterus:  normal size, contour, position, consistency, mobility, non-tender              Adnexa: normal adnexa and no mass, fullness, tenderness               Rectovaginal: Confirms               Anus:  normal sphincter tone, no lesions  Chaperone present: yes  A:  Well Woman with normal exam  Menopausal no HRT  Migraine headaches with Effexor use now with PCP management  Screening labs    P:   Reviewed health and wellness pertinent to exam  Aware of need to evaluate if vaginal bleeding  Continue follow up as indicated  Labs: Vitamin D, Hep. C  Pap smear as above not taken   counseled on breast self exam, mammography screening, adequate intake of calcium and vitamin D, diet and exercise return annually or prn  An After Visit Summary was printed and given to the patient.

## 2016-08-17 LAB — VITAMIN D 25 HYDROXY (VIT D DEFICIENCY, FRACTURES): Vit D, 25-Hydroxy: 40 ng/mL (ref 30–100)

## 2016-08-17 LAB — HEPATITIS C ANTIBODY: HCV Ab: NEGATIVE

## 2016-08-19 NOTE — Progress Notes (Signed)
Encounter reviewed Jill Jertson, MD   

## 2016-08-20 LAB — HEMOGLOBIN, FINGERSTICK: Hemoglobin, fingerstick: 11.9 g/dL — ABNORMAL LOW (ref 12.0–16.0)

## 2016-08-21 ENCOUNTER — Telehealth: Payer: Self-pay | Admitting: Obstetrics & Gynecology

## 2016-08-21 ENCOUNTER — Other Ambulatory Visit: Payer: Self-pay | Admitting: Certified Nurse Midwife

## 2016-08-21 DIAGNOSIS — Z0000 Encounter for general adult medical examination without abnormal findings: Secondary | ICD-10-CM

## 2016-08-21 NOTE — Telephone Encounter (Signed)
Left detailed message, ok per current dpr. Advised as seen below per Melvia Heaps, CNM. Advised to return call to office to schedule lab appt.

## 2016-08-21 NOTE — Telephone Encounter (Signed)
Spoke with patient. Patient calling for Hgb A1c results from [facility] 08/16/16. Advised patient no results or labs for Hgb A1c - patient states discussed at [facility]. Advised I will review with Melvia Heaps, CNM and return call. Patient is agreeable.   Melvia Heaps, CNM -please advise on Hgb A1c?   Cc: Dr. Sabra Heck

## 2016-08-21 NOTE — Telephone Encounter (Signed)
I apologize Hgb A1-C was omitted. I will place order for so she can come to have drawn

## 2016-08-21 NOTE — Telephone Encounter (Signed)
Patient called to check on status of results for "hemoglobin A1C."

## 2016-08-22 ENCOUNTER — Ambulatory Visit
Admission: RE | Admit: 2016-08-22 | Discharge: 2016-08-22 | Disposition: A | Discharge status: Home / Self Care | Payer: 59 | Source: Ambulatory Visit | Attending: Physical Medicine and Rehabilitation | Admitting: Physical Medicine and Rehabilitation

## 2016-08-22 DIAGNOSIS — Z1231 Encounter for screening mammogram for malignant neoplasm of breast: Secondary | ICD-10-CM | POA: Diagnosis not present

## 2016-08-23 NOTE — Telephone Encounter (Signed)
Spoke with patient. Patient states she received message, just ready to schedule at this time. Advised order in place, call office to schedule Lab appt. Patient verbalizes understanding and is agreeable.  Routing to provider for final review. Patient is agreeable to disposition. Will close encounter.

## 2016-08-27 MED FILL — VENLAFAXINE HCL ER 37.5 MG: 37.5 | 90 days supply | Qty: 90 | Fill #1

## 2016-08-29 ENCOUNTER — Other Ambulatory Visit (INDEPENDENT_AMBULATORY_CARE_PROVIDER_SITE_OTHER): Payer: 59

## 2016-08-29 DIAGNOSIS — Z0000 Encounter for general adult medical examination without abnormal findings: Secondary | ICD-10-CM | POA: Diagnosis not present

## 2016-08-29 LAB — HEMOGLOBIN A1C
HEMOGLOBIN A1C: 5.1 % (ref <5.7)
Mean Plasma Glucose: 100 mg/dL

## 2016-09-07 MED FILL — SUMATRIPTAN SUCC 100 MG TAB: 100 | 30 days supply | Qty: 9 | Fill #2

## 2016-10-11 DIAGNOSIS — H11153 Pinguecula, bilateral: Secondary | ICD-10-CM | POA: Diagnosis not present

## 2016-11-15 DIAGNOSIS — D1801 Hemangioma of skin and subcutaneous tissue: Secondary | ICD-10-CM | POA: Diagnosis not present

## 2016-11-15 DIAGNOSIS — D225 Melanocytic nevi of trunk: Secondary | ICD-10-CM | POA: Diagnosis not present

## 2016-11-15 DIAGNOSIS — L814 Other melanin hyperpigmentation: Secondary | ICD-10-CM | POA: Diagnosis not present

## 2016-11-15 DIAGNOSIS — L570 Actinic keratosis: Secondary | ICD-10-CM | POA: Diagnosis not present

## 2016-11-15 DIAGNOSIS — L821 Other seborrheic keratosis: Secondary | ICD-10-CM | POA: Diagnosis not present

## 2016-11-15 DIAGNOSIS — B079 Viral wart, unspecified: Secondary | ICD-10-CM | POA: Diagnosis not present

## 2016-11-19 MED FILL — SUMATRIPTAN SUCC 100 MG TAB: 100 | 30 days supply | Qty: 9 | Fill #3

## 2016-11-20 MED FILL — VENLAFAXINE HCL ER 37.5 MG: 37.5 | 90 days supply | Qty: 90 | Fill #0

## 2016-11-27 DIAGNOSIS — H524 Presbyopia: Secondary | ICD-10-CM | POA: Diagnosis not present

## 2016-12-13 DIAGNOSIS — B079 Viral wart, unspecified: Secondary | ICD-10-CM | POA: Diagnosis not present

## 2016-12-28 DIAGNOSIS — J0100 Acute maxillary sinusitis, unspecified: Secondary | ICD-10-CM | POA: Diagnosis not present

## 2016-12-28 MED FILL — AMOX-CLAV 875-125 MG TABLET: 875-125 | 10 days supply | Qty: 20 | Fill #0

## 2016-12-28 MED FILL — MOMETASONE FUROATE 50 MCG S: 50 | 30 days supply | Qty: 17 | Fill #0

## 2016-12-28 MED FILL — PROMETHAZINE-DM SYRUP: 6.25-15 | 7 days supply | Qty: 140 | Fill #0

## 2017-01-16 DIAGNOSIS — G44219 Episodic tension-type headache, not intractable: Secondary | ICD-10-CM | POA: Diagnosis not present

## 2017-01-16 DIAGNOSIS — G43109 Migraine with aura, not intractable, without status migrainosus: Secondary | ICD-10-CM | POA: Diagnosis not present

## 2017-01-16 DIAGNOSIS — G43009 Migraine without aura, not intractable, without status migrainosus: Secondary | ICD-10-CM | POA: Diagnosis not present

## 2017-02-20 MED FILL — SUMATRIPTAN SUCC 100 MG TAB: 100 | 30 days supply | Qty: 18 | Fill #4

## 2017-02-20 MED FILL — ONDANSETRON ODT 4 MG TABLET: 4 | 5 days supply | Qty: 30 | Fill #0

## 2017-02-20 MED FILL — VENLAFAXINE HCL ER 37.5 MG: 37.5 | 90 days supply | Qty: 90 | Fill #1

## 2017-05-29 MED FILL — SUMATRIPTAN SUCC 100 MG TAB: 100 | 30 days supply | Qty: 18 | Fill #0

## 2017-05-29 MED FILL — VENLAFAXINE HCL ER 37.5 MG: 37.5 | 90 days supply | Qty: 90 | Fill #0

## 2017-07-01 DIAGNOSIS — G43909 Migraine, unspecified, not intractable, without status migrainosus: Secondary | ICD-10-CM | POA: Diagnosis not present

## 2017-07-01 DIAGNOSIS — Z1211 Encounter for screening for malignant neoplasm of colon: Secondary | ICD-10-CM | POA: Diagnosis not present

## 2017-07-01 DIAGNOSIS — E559 Vitamin D deficiency, unspecified: Secondary | ICD-10-CM | POA: Diagnosis not present

## 2017-07-01 DIAGNOSIS — Z0000 Encounter for general adult medical examination without abnormal findings: Secondary | ICD-10-CM | POA: Diagnosis not present

## 2017-07-01 DIAGNOSIS — R739 Hyperglycemia, unspecified: Secondary | ICD-10-CM | POA: Diagnosis not present

## 2017-07-02 MED FILL — SUMATRIPTAN SUCC 100 MG TAB: 100 | 30 days supply | Qty: 18 | Fill #0

## 2017-07-10 DIAGNOSIS — E559 Vitamin D deficiency, unspecified: Secondary | ICD-10-CM | POA: Diagnosis not present

## 2017-07-10 DIAGNOSIS — Z0000 Encounter for general adult medical examination without abnormal findings: Secondary | ICD-10-CM | POA: Diagnosis not present

## 2017-07-10 DIAGNOSIS — R739 Hyperglycemia, unspecified: Secondary | ICD-10-CM | POA: Diagnosis not present

## 2017-08-26 MED FILL — VENLAFAXINE HCL ER 37.5 MG: 37.5 | 90 days supply | Qty: 90 | Fill #0

## 2017-09-02 ENCOUNTER — Other Ambulatory Visit: Payer: Self-pay | Admitting: Physical Medicine and Rehabilitation

## 2017-09-02 DIAGNOSIS — Z1231 Encounter for screening mammogram for malignant neoplasm of breast: Secondary | ICD-10-CM

## 2017-09-30 ENCOUNTER — Ambulatory Visit
Admission: RE | Admit: 2017-09-30 | Discharge: 2017-09-30 | Disposition: A | Discharge status: Home / Self Care | Payer: 59 | Source: Ambulatory Visit | Attending: Physical Medicine and Rehabilitation | Admitting: Physical Medicine and Rehabilitation

## 2017-09-30 DIAGNOSIS — Z1231 Encounter for screening mammogram for malignant neoplasm of breast: Secondary | ICD-10-CM

## 2017-12-02 DIAGNOSIS — H524 Presbyopia: Secondary | ICD-10-CM | POA: Diagnosis not present

## 2017-12-12 ENCOUNTER — Ambulatory Visit: Payer: 59 | Admitting: Obstetrics & Gynecology

## 2017-12-12 ENCOUNTER — Other Ambulatory Visit (HOSPITAL_COMMUNITY)
Admission: RE | Admit: 2017-12-12 | Discharge: 2017-12-12 | Disposition: A | Discharge status: Home / Self Care | Payer: 59 | Source: Ambulatory Visit | Attending: Obstetrics & Gynecology | Admitting: Obstetrics & Gynecology

## 2017-12-12 ENCOUNTER — Encounter: Payer: Self-pay | Admitting: Obstetrics & Gynecology

## 2017-12-12 ENCOUNTER — Other Ambulatory Visit: Payer: Self-pay

## 2017-12-12 VITALS — BP 100/68 | HR 66 | Resp 14 | Ht 63.5 in | Wt 106.0 lb

## 2017-12-12 DIAGNOSIS — Z124 Encounter for screening for malignant neoplasm of cervix: Secondary | ICD-10-CM | POA: Diagnosis not present

## 2017-12-12 DIAGNOSIS — Z113 Encounter for screening for infections with a predominantly sexual mode of transmission: Secondary | ICD-10-CM

## 2017-12-12 DIAGNOSIS — Z01419 Encounter for gynecological examination (general) (routine) without abnormal findings: Secondary | ICD-10-CM

## 2017-12-12 NOTE — Progress Notes (Signed)
52 y.o. G3P3 DivorcedCaucasianF here for annual exam.  Denies vaginal bleeding.  Broken up with significant other.    Patient's last menstrual period was 10/08/2012.          Sexually active: No.  The current method of family planning is post menopausal status.    Exercising: Yes.    yoga, hiking, walking, swimming Smoker:  no  Health Maintenance: Pap:  08/25/15 Neg. HR HPV:neg   08/04/14 neg. HR HPV:neg History of abnormal Pap:  yes MMG:  09/30/17 BIRADS1:Neg  Colonoscopy:  09/29/15 f/u 10 years  BMD:   >14 years ago  TDaP:  2017 Pneumonia vaccine(s):  no Shingrix:   no Hep C testing: 08/16/16 Neg  Screening Labs: PCP, Hb today: PCP, Urine today: not collected   reports that  has never smoked. she has never used smokeless tobacco. She reports that she does not drink alcohol or use drugs.  Past Medical History:  Diagnosis Date  . Abnormal Pap smear 07/2012   ASCUS + HPV, colpo with CIN1  . Allergic rhinitis   . Complication of anesthesia   . Headache(784.0)   . History of nasal polyp   . 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 Hx of migraines    with Aura  . PONV (postoperative nausea and vomiting)     Past Surgical History:  Procedure Laterality Date  . BREAST BIOPSY Right    2016  . BREAST CYST EXCISION  1998   Benign  . BREAST EXCISIONAL BIOPSY    . CESAREAN SECTION  94, 95, 98   x3  . COLPOSCOPY  08/20/12   CIN 1 +HPV  . NASAL POLYP EXCISION     x2  . UTERINE ARTERY EMBOLIZATION  9/09  . WRIST SURGERY Left 2013    Current Outpatient Medications  Medication Sig Dispense Refill  . Cholecalciferol (VITAMIN D3) 10000 units TABS Take 1 tablet by mouth daily.    . Loratadine (CLARITIN PO) Take by mouth as needed.    . SUMAtriptan (IMITREX) 100 MG tablet TAKE 1 TABLET BY MOUTH EVERY 2 HOURS AS NEEDED FOR MIGRAINES 9 tablet 3   No current facility-administered medications for this visit.     Family History  Problem Relation Age of Onset  . Hyperlipidemia Mother   .  Parkinson's disease Mother   . Skin cancer Mother   . Hypertension Father   . Diabetes Father   . Cancer Father        prostate and skin cancer  . Coronary artery disease Father   . Hyperlipidemia Father   . Diabetes Maternal Grandmother   . Hypertension Maternal Grandmother   . Cancer Maternal Grandfather        renal  . Gout Maternal Grandfather   . Renal cancer Maternal Grandfather   . Hypertension Paternal Grandmother   . Hypertension Paternal Grandfather   . Heart attack Paternal Grandfather 1  . Breast cancer Neg Hx     ROS:  Pertinent items are noted in HPI.  Otherwise, a comprehensive ROS was negative.  Exam:   BP 100/68 (BP Location: Right Arm, Patient Position: Sitting, Cuff Size: Normal)   Pulse 66   Resp 14   Ht 5' 3.5" (1.613 m)   Wt 106 lb (48.1 kg)   LMP 10/08/2012   BMI 18.48 kg/m   Weight change: @WEIGHTCHANGE @ Height:   Height: 5' 3.5" (161.3 cm)  Ht Readings from Last 3 Encounters:  12/12/17 5' 3.5" (1.613 m)  08/16/16 5' 2.75" (1.594  m)  07/25/16 5\' 3"  (1.6 m)    General appearance: alert, cooperative and appears stated age Head: Normocephalic, without obvious abnormality, atraumatic Neck: no adenopathy, supple, symmetrical, trachea midline and thyroid normal to inspection and palpation Lungs: clear to auscultation bilaterally Breasts: normal appearance, no masses or tenderness Heart: regular rate and rhythm Abdomen: soft, non-tender; bowel sounds normal; no masses,  no organomegaly Extremities: extremities normal, atraumatic, no cyanosis or edema Skin: Skin color, texture, turgor normal. No rashes or lesions Lymph nodes: Cervical, supraclavicular, and axillary nodes normal. No abnormal inguinal nodes palpated Neurologic: Grossly normal   Pelvic: External genitalia:  no lesions              Urethra:  normal appearing urethra with no masses, tenderness or lesions              Bartholins and Skenes: normal                 Vagina: normal  appearing vagina with normal color and discharge, no lesions              Cervix: no lesions              Pap taken: Yes.   Bimanual Exam:  Uterus:  normal size, contour, position, consistency, mobility, non-tender              Adnexa: normal adnexa and no mass, fullness, tenderness               Rectovaginal: Confirms               Anus:  normal sphincter tone, no lesions  Chaperone was present for exam.  A:  Well Woman with normal exam PMP, no HRT Headaches, improved with menopause and stopping Effexor SA last year H/o calcified fibroid, last PUS 11/16  P:   Mammogram guidelines reviewed.  Doing 3D. pap smear obtained today.  Will test GC/Chl today as well. Plan BMD at age 31 Will plan lab work next year Return annually or prn

## 2017-12-14 LAB — CYTOLOGY - PAP
CHLAMYDIA, DNA PROBE: NEGATIVE
Diagnosis: NEGATIVE
Neisseria Gonorrhea: NEGATIVE

## 2017-12-20 MED FILL — SUMATRIPTAN SUCC 100 MG TAB: 100 | 30 days supply | Qty: 8 | Fill #1

## 2018-02-24 MED FILL — SUMATRIPTAN SUCC 100 MG TAB: 100 | 90 days supply | Qty: 27 | Fill #0

## 2018-05-15 DIAGNOSIS — L821 Other seborrheic keratosis: Secondary | ICD-10-CM | POA: Diagnosis not present

## 2018-05-15 DIAGNOSIS — D225 Melanocytic nevi of trunk: Secondary | ICD-10-CM | POA: Diagnosis not present

## 2018-05-15 DIAGNOSIS — L814 Other melanin hyperpigmentation: Secondary | ICD-10-CM | POA: Diagnosis not present

## 2018-05-15 DIAGNOSIS — D1801 Hemangioma of skin and subcutaneous tissue: Secondary | ICD-10-CM | POA: Diagnosis not present

## 2018-06-19 MED FILL — SUMAtriptan SUCCINATE 100 M: 100 | 90 days supply | Qty: 27 | Fill #1

## 2018-07-09 DIAGNOSIS — E559 Vitamin D deficiency, unspecified: Secondary | ICD-10-CM | POA: Diagnosis not present

## 2018-07-09 DIAGNOSIS — Z0000 Encounter for general adult medical examination without abnormal findings: Secondary | ICD-10-CM | POA: Diagnosis not present

## 2018-07-09 DIAGNOSIS — R739 Hyperglycemia, unspecified: Secondary | ICD-10-CM | POA: Diagnosis not present

## 2018-07-09 DIAGNOSIS — G43909 Migraine, unspecified, not intractable, without status migrainosus: Secondary | ICD-10-CM | POA: Diagnosis not present

## 2018-10-06 ENCOUNTER — Other Ambulatory Visit: Payer: Self-pay | Admitting: Obstetrics & Gynecology

## 2018-10-06 DIAGNOSIS — Z1231 Encounter for screening mammogram for malignant neoplasm of breast: Secondary | ICD-10-CM

## 2018-11-06 ENCOUNTER — Ambulatory Visit
Admission: RE | Admit: 2018-11-06 | Discharge: 2018-11-06 | Disposition: A | Discharge status: Home / Self Care | Payer: 59 | Source: Ambulatory Visit | Attending: Obstetrics & Gynecology | Admitting: Obstetrics & Gynecology

## 2018-11-06 ENCOUNTER — Other Ambulatory Visit: Payer: Self-pay | Admitting: Obstetrics & Gynecology

## 2018-11-06 DIAGNOSIS — Z1231 Encounter for screening mammogram for malignant neoplasm of breast: Secondary | ICD-10-CM

## 2018-11-10 MED FILL — SUMAtriptan SUCCINATE 100 M: 100 | 90 days supply | Qty: 27 | Fill #2

## 2018-11-13 DIAGNOSIS — H524 Presbyopia: Secondary | ICD-10-CM | POA: Diagnosis not present

## 2019-02-16 ENCOUNTER — Ambulatory Visit: Payer: 59 | Admitting: Obstetrics & Gynecology

## 2019-04-02 MED FILL — SUMAtriptan SUCCINATE 100 M: 100 | 90 days supply | Qty: 27 | Fill #0

## 2019-04-20 ENCOUNTER — Ambulatory Visit (INDEPENDENT_AMBULATORY_CARE_PROVIDER_SITE_OTHER): Payer: 59 | Admitting: Obstetrics & Gynecology

## 2019-04-20 ENCOUNTER — Other Ambulatory Visit (HOSPITAL_COMMUNITY)
Admission: RE | Admit: 2019-04-20 | Discharge: 2019-04-20 | Disposition: A | Discharge status: Home / Self Care | Payer: 59 | Source: Ambulatory Visit | Attending: Obstetrics & Gynecology | Admitting: Obstetrics & Gynecology

## 2019-04-20 ENCOUNTER — Other Ambulatory Visit: Payer: Self-pay

## 2019-04-20 ENCOUNTER — Encounter: Payer: Self-pay | Admitting: Obstetrics & Gynecology

## 2019-04-20 VITALS — BP 118/80 | HR 62 | Temp 97.4°F | Ht 63.0 in | Wt 104.8 lb

## 2019-04-20 DIAGNOSIS — Z0000 Encounter for general adult medical examination without abnormal findings: Secondary | ICD-10-CM | POA: Diagnosis not present

## 2019-04-20 DIAGNOSIS — Z01419 Encounter for gynecological examination (general) (routine) without abnormal findings: Secondary | ICD-10-CM | POA: Diagnosis not present

## 2019-04-20 DIAGNOSIS — Z124 Encounter for screening for malignant neoplasm of cervix: Secondary | ICD-10-CM | POA: Diagnosis not present

## 2019-04-20 MED ORDER — SUMATRIPTAN SUCCINATE 100 MG PO TABS: 100.0000 mg | ORAL_TABLET | ORAL | 1 refills | Status: DC | PRN

## 2019-04-20 NOTE — Progress Notes (Signed)
53 y.o. G3P3 Divorced White or Caucasian female here for annual exam.  Doing well.  Oldest son is still having some trouble with the adjustment that has been needed during Covid.  Reports mother has Lewy body dementia that has been really hard.    Denies vaginal bleeding.   She is dating someone just over the past few weeks.  Is newly SA.  Does not need STD testing.    PCP:  Dr. Harrington Challenger.  She is planning to switch PCPs due to Dr. Harrington Challenger' office being on a different EMR.  She is going to switch to Dr. Alain Marion 's office.    Patient's last menstrual period was 10/08/2012.          Sexually active: Yes.    The current method of family planning is post menopausal status.    Exercising: Yes.    yoga, walking, hiking, high intensity training  Smoker:  no  Health Maintenance: Pap:  12/12/17 Neg  08/25/15 Neg. HR PV:neg  History of abnormal Pap:  Yes, HR HPV:+ MMG:  11/06/18 BIRADS1:neg  Colonoscopy:  09/29/15 Normal. F/u 10 years  BMD:   2005.  Will plan BMD around age 54. TDaP:  2017 Pneumonia vaccine(s):  n/a Shingrix:   Reviewed today. Hep C testing: 08/16/16 neg  Screening Labs: Here today - fasting    reports that she has never smoked. She has never used smokeless tobacco. She reports that she does not drink alcohol or use drugs.  Past Medical History:  Diagnosis Date  . Abnormal Pap smear 07/2012   ASCUS + HPV, colpo with CIN1  . Allergic rhinitis   . Complication of anesthesia   . Headache(784.0)   . History of nasal polyp   . 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 Hx of migraines    with Aura  . PONV (postoperative nausea and vomiting)     Past Surgical History:  Procedure Laterality Date  . BREAST BIOPSY Right    2016  . BREAST CYST EXCISION  1998   Benign  . BREAST EXCISIONAL BIOPSY    . CESAREAN SECTION  94, 95, 98   x3  . COLPOSCOPY  08/20/12   CIN 1 +HPV  . NASAL POLYP EXCISION     x2  . UTERINE ARTERY EMBOLIZATION  9/09  . WRIST SURGERY Left 2013    Current Outpatient  Medications  Medication Sig Dispense Refill  . Cholecalciferol (VITAMIN D3) 10000 units TABS Take 1 tablet by mouth daily.    . Loratadine (CLARITIN PO) Take by mouth as needed.    . SUMAtriptan (IMITREX) 100 MG tablet TAKE 1 TABLET BY MOUTH EVERY 2 HOURS AS NEEDED FOR MIGRAINES 9 tablet 3   No current facility-administered medications for this visit.     Family History  Problem Relation Age of Onset  . Hyperlipidemia Mother   . Parkinson's disease Mother   . Skin cancer Mother   . Hypertension Father   . Diabetes Father   . Cancer Father        prostate and skin cancer  . Coronary artery disease Father   . Hyperlipidemia Father   . Diabetes Maternal Grandmother   . Hypertension Maternal Grandmother   . Cancer Maternal Grandfather        renal  . Gout Maternal Grandfather   . Renal cancer Maternal Grandfather   . Hypertension Paternal Grandmother   . Hypertension Paternal Grandfather   . Heart attack Paternal Grandfather 47  . Breast cancer Neg Hx  Review of Systems  All other systems reviewed and are negative.   Exam:   BP 118/80   Pulse 62   Temp (!) 97.4 F (36.3 C) (Temporal)   Ht 5\' 3"  (1.6 m)   Wt 104 lb 12.8 oz (47.5 kg)   LMP 10/08/2012   BMI 18.56 kg/m    Height: 5\' 3"  (160 cm)  Ht Readings from Last 3 Encounters:  04/20/19 5\' 3"  (1.6 m)  12/12/17 5' 3.5" (1.613 m)  08/16/16 5' 2.75" (1.594 m)    General appearance: alert, cooperative and appears stated age Head: Normocephalic, without obvious abnormality, atraumatic Neck: no adenopathy, supple, symmetrical, trachea midline and thyroid normal to inspection and palpation Lungs: clear to auscultation bilaterally Breasts: normal appearance, no masses or tenderness Heart: regular rate and rhythm Abdomen: soft, non-tender; bowel sounds normal; no masses,  no organomegaly Extremities: extremities normal, atraumatic, no cyanosis or edema Skin: Skin color, texture, turgor normal. No rashes or  lesions Lymph nodes: Cervical, supraclavicular, and axillary nodes normal. No abnormal inguinal nodes palpated Neurologic: Grossly normal  Pelvic: External genitalia:  no lesions              Urethra:  normal appearing urethra with no masses, tenderness or lesions              Bartholins and Skenes: normal                 Vagina: normal appearing vagina with normal color and discharge, no lesions              Cervix: no lesions              Pap taken: Yes.   Bimanual Exam:  Uterus:  normal size, contour, position, consistency, mobility, non-tender              Adnexa: normal adnexa and no mass, fullness, tenderness               Rectovaginal: Confirms               Anus:  normal sphincter tone, no lesions  Chaperone was present for exam.  A:  Well Woman with normal exam PMP, no HRT H/o migraines with aura H/o calcified fibroids, last PUS 11/16.  [place] 9/09.  P:   Mammogram guidelines reviewed.   pap smear with HR HPV obtained today Declines STD testing today D/w pt having a BMD around age 58 Shingrix vaccination discussed.  Information provided. CBC, CMP, Lipids, TSH, Vit D obtained today RF for Imitrex 100mg  x 1, repeat in 2 hours if needed.  #27/1RF Return annually or prn

## 2019-04-21 LAB — LIPID PANEL
Chol/HDL Ratio: 2.8 ratio (ref 0.0–4.4)
Cholesterol, Total: 218 mg/dL — ABNORMAL HIGH (ref 100–199)
HDL: 79 mg/dL (ref >39)
LDL Calculated: 123 mg/dL — ABNORMAL HIGH (ref 0–99)
Triglycerides: 79 mg/dL (ref 0–149)
VLDL Cholesterol Cal: 16 mg/dL (ref 5–40)

## 2019-04-21 LAB — COMPREHENSIVE METABOLIC PANEL
ALT: 11 IU/L (ref 0–32)
AST: 15 IU/L (ref 0–40)
Albumin/Globulin Ratio: 2 (ref 1.2–2.2)
Albumin: 4.3 g/dL (ref 3.8–4.9)
Alkaline Phosphatase: 54 IU/L (ref 39–117)
BUN/Creatinine Ratio: 14 (ref 9–23)
BUN: 9 mg/dL (ref 6–24)
Bilirubin Total: 0.4 mg/dL (ref 0.0–1.2)
CO2: 25 mmol/L (ref 20–29)
Calcium: 9.1 mg/dL (ref 8.7–10.2)
Chloride: 102 mmol/L (ref 96–106)
Creatinine, Ser: 0.65 mg/dL (ref 0.57–1.00)
GFR calc Af Amer: 117 mL/min/{1.73_m2} (ref >59)
GFR calc non Af Amer: 102 mL/min/{1.73_m2} (ref >59)
Globulin, Total: 2.2 g/dL (ref 1.5–4.5)
Glucose: 103 mg/dL — ABNORMAL HIGH (ref 65–99)
Potassium: 4.1 mmol/L (ref 3.5–5.2)
Sodium: 139 mmol/L (ref 134–144)
Total Protein: 6.5 g/dL (ref 6.0–8.5)

## 2019-04-21 LAB — CBC
Hematocrit: 40.5 % (ref 34.0–46.6)
Hemoglobin: 12.8 g/dL (ref 11.1–15.9)
MCH: 28.8 pg (ref 26.6–33.0)
MCHC: 31.6 g/dL (ref 31.5–35.7)
MCV: 91 fL (ref 79–97)
Platelets: 329 10*3/uL (ref 150–450)
RBC: 4.45 x10E6/uL (ref 3.77–5.28)
RDW: 13.4 % (ref 11.7–15.4)
WBC: 4.6 10*3/uL (ref 3.4–10.8)

## 2019-04-21 LAB — VITAMIN D 25 HYDROXY (VIT D DEFICIENCY, FRACTURES): Vit D, 25-Hydroxy: 27.4 ng/mL — ABNORMAL LOW (ref 30.0–100.0)

## 2019-04-21 LAB — HEMOGLOBIN A1C
Est. average glucose Bld gHb Est-mCnc: 111 mg/dL
Hgb A1c MFr Bld: 5.5 % (ref 4.8–5.6)

## 2019-04-21 LAB — TSH: TSH: 2.86 u[IU]/mL (ref 0.450–4.500)

## 2019-04-22 LAB — CYTOLOGY - PAP
Diagnosis: NEGATIVE
HPV: NOT DETECTED

## 2019-07-20 ENCOUNTER — Encounter: Payer: Self-pay | Admitting: Family

## 2019-07-20 ENCOUNTER — Other Ambulatory Visit: Payer: Self-pay

## 2019-07-20 ENCOUNTER — Ambulatory Visit (INDEPENDENT_AMBULATORY_CARE_PROVIDER_SITE_OTHER): Payer: 59 | Admitting: Family

## 2019-07-20 VITALS — BP 106/78 | HR 80 | Temp 98.7°F | Ht 63.0 in | Wt 104.8 lb

## 2019-07-20 DIAGNOSIS — G43709 Chronic migraine without aura, not intractable, without status migrainosus: Secondary | ICD-10-CM | POA: Diagnosis not present

## 2019-07-20 DIAGNOSIS — Z86018 Personal history of other benign neoplasm: Secondary | ICD-10-CM

## 2019-07-20 DIAGNOSIS — Z0000 Encounter for general adult medical examination without abnormal findings: Secondary | ICD-10-CM | POA: Diagnosis not present

## 2019-07-20 MED ORDER — SUMATRIPTAN SUCCINATE 100 MG PO TABS: 100.0000 mg | ORAL_TABLET | ORAL | 1 refills | Status: DC | PRN

## 2019-07-20 MED ORDER — ONDANSETRON 4 MG PO TBDP: 4.0000 mg | ORAL_TABLET | Freq: Three times a day (TID) | ORAL | 0 refills | Status: DC | PRN

## 2019-07-20 MED FILL — SUMAtriptan SUCCINATE 100 M: 100 | 30 days supply | Qty: 9 | Fill #0

## 2019-07-20 MED FILL — ONDANSETRON ODT 4 MG TABLET: 4 | 7 days supply | Qty: 20 | Fill #0

## 2019-07-20 NOTE — Progress Notes (Signed)
Marie Gross is a 53 y.o. female with the following history as recorded in EpicCare:  Patient Active Problem List   Diagnosis Date Noted  . Intramural leiomyoma of uterus 08/28/2015  . Migraine headache 07/18/2007  . NASAL POLYP 07/18/2007  . ALLERGIC RHINITIS 07/18/2007    Current Outpatient Medications  Medication Sig Dispense Refill  . Cholecalciferol (VITAMIN D3) 10000 units TABS Take 1 tablet by mouth daily.    . Loratadine (CLARITIN PO) Take by mouth as needed.    . SUMAtriptan (IMITREX) 100 MG tablet Take 1 tablet (100 mg total) by mouth every 2 (two) hours as needed for migraine. Max dose is 200mg /24 hours 27 tablet 1  . ondansetron (ZOFRAN ODT) 4 MG disintegrating tablet Take 1 tablet (4 mg total) by mouth every 8 (eight) hours as needed for nausea or vomiting. 20 tablet 0   No current facility-administered medications for this visit.     Allergies: Penicillins, Sulfonamide derivatives, and Tetracycline  Past Medical History:  Diagnosis Date  . Abnormal Pap smear 07/2012   ASCUS + HPV, colpo with CIN1  . Allergic rhinitis   . Complication of anesthesia   . Headache(784.0)   . History of nasal polyp   . 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 Hx of migraines    with Aura  . PONV (postoperative nausea and vomiting)     Past Surgical History:  Procedure Laterality Date  . BREAST BIOPSY Right    2016  . BREAST CYST EXCISION  1998   Benign  . BREAST EXCISIONAL BIOPSY    . CESAREAN SECTION  94, 95, 98   x3  . COLPOSCOPY  08/20/12   CIN 1 +HPV  . NASAL POLYP EXCISION     x2  . UTERINE ARTERY EMBOLIZATION  9/09  . WRIST SURGERY Left 2013    Family History  Problem Relation Age of Onset  . Hyperlipidemia Mother   . Parkinson's disease Mother   . Skin cancer Mother   . Dementia Mother        Lewy body dementia  . Hypertension Father   . Diabetes Father   . Cancer Father        prostate and skin cancer  . Coronary artery disease Father   . Hyperlipidemia Father   .  Diabetes Maternal Grandmother   . Hypertension Maternal Grandmother   . Cancer Maternal Grandfather        renal  . Gout Maternal Grandfather   . Renal cancer Maternal Grandfather   . Hypertension Paternal Grandmother   . Hypertension Paternal Grandfather   . Heart attack Paternal Grandfather 51  . Breast cancer Neg Hx     Social History   Tobacco Use  . Smoking status: Never Smoker  . Smokeless tobacco: Never Used  Substance Use Topics  . Alcohol use: No    Subjective:  Patient presents today as a new patient. Requesting CPE for employer; Transferring from provider with [facility]- needs a provider in the [facility] system. In baseline state of health today; up to date on eye exam and dentist;   1) Requesting refill on Imitrex; notes she can have up to 8 migraines per month but is not interested in taking any type of daily preventive medication.  2) Requesting referral to dermatology- history of atypical mole 2008;   CPE done through GYN; labs done earlier this year as well;   Health Maintenance  Topic Date Due  . HIV Screening  04/20/1980  . MAMMOGRAM  11/06/2020  . PAP SMEAR-Modifier  04/19/2022  . COLONOSCOPY  09/28/2025  . TETANUS/TDAP  04/14/2026  . INFLUENZA VACCINE  Completed     Review of Systems  Constitutional: Negative.   HENT: Negative.   Eyes: Negative for blurred vision.  Respiratory: Negative for shortness of breath.   Cardiovascular: Negative for chest pain.  Gastrointestinal: Negative for abdominal pain, constipation and diarrhea.  Genitourinary: Negative.   Musculoskeletal: Negative.   Skin: Negative.   Neurological: Positive for headaches.       Chronic migraines  Endo/Heme/Allergies: Negative.   Psychiatric/Behavioral: Negative.      Objective:  Vitals:   07/20/19 1326  BP: 106/78  Pulse: 80  Temp: 98.7 F (37.1 C)  TempSrc: Oral  SpO2: 97%  Weight: 104 lb 12.8 oz (47.5 kg)  Height: 5\' 3"  (1.6 m)    General: Well developed, well nourished,  in no acute distress  Skin : Warm and dry.  Head: Normocephalic and atraumatic  Eyes: Sclera and conjunctiva clear; pupils round and reactive to light; extraocular movements intact  Ears: External normal; canals clear; tympanic membranes normal  Oropharynx: Pink, supple. No suspicious lesions  Neck: Supple without thyromegaly, adenopathy  Lungs: Respirations unlabored; clear to auscultation bilaterally without wheeze, rales, rhonchi  CVS exam: normal rate and regular rhythm.  Abdomen: Soft; nontender; nondistended; normoactive bowel sounds; no masses or hepatosplenomegaly  Musculoskeletal: No deformities; no active joint inflammation  Extremities: No edema, cyanosis, clubbing  Vessels: Symmetric bilaterally  Neurologic: Alert and oriented; speech intact; face symmetrical; moves all extremities well; CNII-XII intact without focal deficit   Assessment:   1. PE (physical exam), annual   2. History of atypical skin mole   3. Chronic migraine without aura without status migrainosus, not intractable     Plan:  Age appropriate preventive healthcare needs addressed; encouraged regular eye doctor and dental exams; encouraged regular exercise; will update labs and refills as needed today; follow-up to be determined; Discussed treatment options for migraines- patient does not want to take daily medications at this time. Referrals updated as requested.   No follow-ups on file.  Orders Placed This Encounter  Procedures  . Ambulatory referral to Dermatology    Referral Priority:   Routine    Referral Type:   Consultation    Referral Reason:   Specialty Services Required    Requested Specialty:   Dermatology    Number of Visits Requested:   1    Requested Prescriptions   Signed Prescriptions Disp Refills  . SUMAtriptan (IMITREX) 100 MG tablet 27 tablet 1    Sig: Take 1 tablet (100 mg total) by mouth every 2 (two) hours as needed for migraine. Max dose is 200mg /24 hours  . ondansetron  (ZOFRAN ODT) 4 MG disintegrating tablet 20 tablet 0    Sig: Take 1 tablet (4 mg total) by mouth every 8 (eight) hours as needed for nausea or vomiting.

## 2019-09-14 MED FILL — SUMAtriptan SUCCINATE 100 M: 100 | 30 days supply | Qty: 9 | Fill #0

## 2019-09-14 MED FILL — ONDANSETRON ODT 4 MG TABLET: 4 | 7 days supply | Qty: 20 | Fill #0

## 2019-10-15 ENCOUNTER — Other Ambulatory Visit: Payer: Self-pay | Admitting: Obstetrics & Gynecology

## 2019-10-15 DIAGNOSIS — Z1231 Encounter for screening mammogram for malignant neoplasm of breast: Secondary | ICD-10-CM

## 2019-10-20 DIAGNOSIS — D485 Neoplasm of uncertain behavior of skin: Secondary | ICD-10-CM | POA: Diagnosis not present

## 2019-10-20 DIAGNOSIS — D1801 Hemangioma of skin and subcutaneous tissue: Secondary | ICD-10-CM | POA: Diagnosis not present

## 2019-10-20 DIAGNOSIS — L812 Freckles: Secondary | ICD-10-CM | POA: Diagnosis not present

## 2019-10-20 DIAGNOSIS — D2239 Melanocytic nevi of other parts of face: Secondary | ICD-10-CM | POA: Diagnosis not present

## 2019-11-23 ENCOUNTER — Ambulatory Visit
Admission: RE | Admit: 2019-11-23 | Discharge: 2019-11-23 | Disposition: A | Discharge status: Home / Self Care | Payer: 59 | Source: Ambulatory Visit | Attending: Obstetrics & Gynecology | Admitting: Obstetrics & Gynecology

## 2019-11-23 ENCOUNTER — Other Ambulatory Visit: Payer: Self-pay

## 2019-11-23 DIAGNOSIS — Z1231 Encounter for screening mammogram for malignant neoplasm of breast: Secondary | ICD-10-CM | POA: Diagnosis not present

## 2020-01-07 DIAGNOSIS — H524 Presbyopia: Secondary | ICD-10-CM | POA: Diagnosis not present

## 2020-02-12 ENCOUNTER — Other Ambulatory Visit: Payer: Self-pay

## 2020-02-12 ENCOUNTER — Ambulatory Visit: Payer: 59 | Admitting: Family

## 2020-02-12 ENCOUNTER — Encounter: Payer: Self-pay | Admitting: Family

## 2020-02-12 VITALS — BP 110/68 | HR 69 | Temp 98.0°F | Ht 63.0 in | Wt 104.8 lb

## 2020-02-12 DIAGNOSIS — M25512 Pain in left shoulder: Secondary | ICD-10-CM | POA: Diagnosis not present

## 2020-02-12 MED ORDER — MELOXICAM 15 MG PO TABS: 15.0000 mg | ORAL_TABLET | Freq: Every day | ORAL | 0 refills | Status: DC

## 2020-02-12 MED FILL — MELOXICAM 15 MG TABLET: 15 | 30 days supply | Qty: 30 | Fill #0

## 2020-02-12 NOTE — Progress Notes (Signed)
Marie Gross is a 54 y.o. female with the following history as recorded in EpicCare:  Patient Active Problem List   Diagnosis Date Noted  . Intramural leiomyoma of uterus 08/28/2015  . Migraine headache 07/18/2007  . NASAL POLYP 07/18/2007  . ALLERGIC RHINITIS 07/18/2007    Current Outpatient Medications  Medication Sig Dispense Refill  . Cholecalciferol (VITAMIN D3) 10000 units TABS Take 1 tablet by mouth daily.    . Loratadine (CLARITIN PO) Take by mouth as needed.    . ondansetron (ZOFRAN ODT) 4 MG disintegrating tablet Take 1 tablet (4 mg total) by mouth every 8 (eight) hours as needed for nausea or vomiting. 20 tablet 0  . SUMAtriptan (IMITREX) 100 MG tablet Take 1 tablet (100 mg total) by mouth every 2 (two) hours as needed for migraine. Max dose is 200mg /24 hours 27 tablet 1  . meloxicam (MOBIC) 15 MG tablet Take 1 tablet (15 mg total) by mouth daily. 30 tablet 0   No current facility-administered medications for this visit.    Allergies: Penicillins, Sulfonamide derivatives, and Tetracycline  Past Medical History:  Diagnosis Date  . Abnormal Pap smear 07/2012   ASCUS + HPV, colpo with CIN1  . Allergic rhinitis   . Complication of anesthesia   . Headache(784.0)   . History of nasal polyp   . 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 Hx of migraines    with Aura  . PONV (postoperative nausea and vomiting)     Past Surgical History:  Procedure Laterality Date  . BREAST BIOPSY Right    2016  . BREAST CYST EXCISION  1998   Benign  . BREAST EXCISIONAL BIOPSY    . CESAREAN SECTION  94, 95, 98   x3  . COLPOSCOPY  08/20/12   CIN 1 +HPV  . NASAL POLYP EXCISION     x2  . UTERINE ARTERY EMBOLIZATION  9/09  . WRIST SURGERY Left 2013    Family History  Problem Relation Age of Onset  . Hyperlipidemia Mother   . Parkinson's disease Mother   . Skin cancer Mother   . Dementia Mother        Lewy body dementia  . Hypertension Father   . Diabetes Father   . Cancer Father        prostate  and skin cancer  . Coronary artery disease Father   . Hyperlipidemia Father   . Diabetes Maternal Grandmother   . Hypertension Maternal Grandmother   . Cancer Maternal Grandfather        renal  . Gout Maternal Grandfather   . Renal cancer Maternal Grandfather   . Hypertension Paternal Grandmother   . Hypertension Paternal Grandfather   . Heart attack Paternal Grandfather 67  . Breast cancer Neg Hx     Social History   Tobacco Use  . Smoking status: Never Smoker  . Smokeless tobacco: Never Used  Substance Use Topics  . 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  Head: Normocephalic and atraumatic  Lungs: Respirations unlabored;  Musculoskeletal: No deformities; no active joint inflammation  Extremities: No edema, cyanosis, clubbing  Vessels: Symmetric bilaterally  Neurologic: Alert and oriented; speech intact; face symmetrical; moves all extremities well; CNII-XII intact without focal deficit   Assessment:  1. Acute pain of left shoulder     Plan:  Suspect tendonitis; Rx for Mobic 15 mg daily; will refer to orthopedics- she can follow-up if symptoms persist.  This visit occurred during the SARS-CoV-2 public health emergency.  Safety protocols were in place, including screening questions prior to the visit, additional usage of staff PPE, and extensive cleaning of exam room  while observing appropriate contact time as indicated for disinfecting solutions.     No follow-ups on file.  Orders Placed This Encounter  Procedures  . Ambulatory referral to Orthopedic Surgery    Referral Priority:   Routine    Referral Type:   Surgical    Referral Reason:   Specialty Services Required    Requested Specialty:   Orthopedic Surgery    Number of Visits Requested:   1    Requested Prescriptions   Signed Prescriptions Disp Refills  . meloxicam (MOBIC) 15 MG tablet 30 tablet 0    Sig: Take 1 tablet (15 mg total) by mouth daily.

## 2020-02-26 ENCOUNTER — Encounter: Payer: Self-pay | Admitting: Orthopaedic Surgery

## 2020-02-26 ENCOUNTER — Ambulatory Visit: Payer: 59 | Admitting: Orthopaedic Surgery

## 2020-02-26 ENCOUNTER — Other Ambulatory Visit: Payer: Self-pay

## 2020-02-26 ENCOUNTER — Ambulatory Visit (INDEPENDENT_AMBULATORY_CARE_PROVIDER_SITE_OTHER): Payer: 59

## 2020-02-26 VITALS — Ht 62.0 in | Wt 104.0 lb

## 2020-02-26 DIAGNOSIS — M25512 Pain in left shoulder: Secondary | ICD-10-CM | POA: Diagnosis not present

## 2020-02-26 DIAGNOSIS — G8929 Other chronic pain: Secondary | ICD-10-CM

## 2020-02-26 NOTE — Progress Notes (Signed)
Office Visit Note   Patient: CASEY NEGRO           Date of Birth: 08-Apr-1966           MRN: [id number] Visit Date: 02/26/2020              Requested by: Marrian Salvage, [address],  [place] [address] PCP: Marrian Salvage, FNP   Assessment & Plan: Visit Diagnoses:  1. Chronic left shoulder pain     Plan: Impression is superior rotator cuff tendinosis.  She has had a good response from the Mobic so far and so she will continue to take these.  I have given her home exercises.  Not symptomatic enough for cortisone injection.  Follow-up as needed.  Follow-Up Instructions: No follow-ups on file.   Orders:  Orders Placed This Encounter  Procedures  . XR Shoulder Left   No orders of the defined types were placed in this encounter.     Procedures: No procedures performed   Clinical Data: No additional findings.   Subjective: Chief Complaint  Patient presents with  . 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  She is referral from her PCP.  She endorses pain in the lateral upper arm region that catches when rotating.  Denies any radicular symptoms.  She is left-hand dominant.  Denies any history of a previous surgeries.  She was started on meloxicam by her PCP and has helped the pain.   Review of Systems  Constitutional: Negative.   HENT: Negative.   Eyes: Negative.   Respiratory: Negative.   Cardiovascular: Negative.   Endocrine: Negative.   Musculoskeletal: Negative.   Neurological: Negative.   Hematological: Negative.   Psychiatric/Behavioral: Negative.   All other systems reviewed and are negative.    Objective: Vital Signs: Ht 5\' 2"  (1.575 m)   Wt 104 lb (47.2 kg)   LMP 10/08/2012   BMI 19.02 kg/m   Physical Exam Vitals and nursing note reviewed.  Constitutional:      Appearance: She is  well-developed.  HENT:     Head: Normocephalic and atraumatic.  Pulmonary:     Effort: Pulmonary effort is normal.  Abdominal:     Palpations: Abdomen is soft.  Musculoskeletal:     Cervical back: Neck supple.  Skin:    General: Skin is warm.     Capillary Refill: Capillary refill takes less than 2 seconds.  Neurological:     Mental Status: She is alert and oriented to person, place, and time.  Psychiatric:        Behavior: Behavior normal.        Thought Content: Thought content normal.        Judgment: Judgment normal.     Ortho Exam Left shoulder shows full range of motion.  Normal manual muscle testing.  No impingement signs.  Negative empty can.  No tenderness palpation of the bicipital groove.  Mild discomfort with supraspinatus testing. Specialty Comments:  No specialty comments available.  Imaging: XR Shoulder Left  Result Date: 02/26/2020 No acute or structural abnormalities    PMFS History: Patient Active Problem List   Diagnosis Date Noted  . Intramural leiomyoma of uterus 08/28/2015  . Migraine headache 07/18/2007  . NASAL POLYP 07/18/2007  . ALLERGIC RHINITIS 07/18/2007   Past Medical History:  Diagnosis Date  .  Abnormal Pap smear 07/2012   ASCUS + HPV, colpo with CIN1  . Allergic rhinitis   . Complication of anesthesia   . Headache(784.0)   . History of nasal polyp   . 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 Hx of migraines    with Aura  . PONV (postoperative nausea and vomiting)     Family History  Problem Relation Age of Onset  . Hyperlipidemia Mother   . Parkinson's disease Mother   . Skin cancer Mother   . Dementia Mother        Lewy body dementia  . Hypertension Father   . Diabetes Father   . Cancer Father        prostate and skin cancer  . Coronary artery disease Father   . Hyperlipidemia Father   . Diabetes Maternal Grandmother   . Hypertension Maternal Grandmother   . Cancer Maternal Grandfather        renal  . Gout Maternal Grandfather     . Renal cancer Maternal Grandfather   . Hypertension Paternal Grandmother   . Hypertension Paternal Grandfather   . Heart attack Paternal Grandfather 67  . Breast cancer Neg Hx     Past Surgical History:  Procedure Laterality Date  . BREAST BIOPSY Right    2016  . BREAST CYST EXCISION  1998   Benign  . BREAST EXCISIONAL BIOPSY    . CESAREAN SECTION  94, 95, 98   x3  . COLPOSCOPY  08/20/12   CIN 1 +HPV  . NASAL POLYP EXCISION     x2  . UTERINE ARTERY EMBOLIZATION  9/09  . WRIST SURGERY Left 2013   Social History   Occupational History  . Occupation: Marine scientist at [employer]  Tobacco Use  . Smoking status: Never Smoker  . Smokeless tobacco: Never Used  Substance and Sexual Activity  . Alcohol use: No  . Drug use: No  . Sexual activity: Yes    Partners: Male

## 2020-06-10 NOTE — Progress Notes (Signed)
54 y.o. G3P3 Divorced White or Caucasian female here for annual exam.  Doing well.  Denies vaginal bleeding.  She is seeing Dr. Martin Majestic at [facility].  Going for yearly skin surveys.     Mother has Lewy body dementia.  Still living at home with her dad.  He has medical issues as well.  They live in [place] and are near her sister.    Her sons are doing well.  Betsy Coder has a job.    PCP:  Jodi Mourning  Patient's last menstrual period was 10/08/2012.          Sexually active: No.  The current method of family planning is post menopausal status.    Exercising: Yes.    hiking, walking, yoga, and HIIT Smoker:  no  Health Maintenance: Pap:  12-12-17 neg, 04-20-2019 neg HPV HR neg History of abnormal Pap:  yes MMG:  11-24-2019 category c density birads 1:neg Colonoscopy:  09-29-15 normal f/u 10 yrs BMD:   2005 TDaP:  2017 Pneumonia vaccine(s):  n/a Shingrix:   No, patient is interested Hep C testing: neg 2017 Screening Labs: done 04/2019.     reports that she has never smoked. She has never used smokeless tobacco. She reports current alcohol use of about 2.0 standard drinks of alcohol per week. She reports that she does not use drugs.  Past Medical History:  Diagnosis Date  . Abnormal Pap smear 07/2012   ASCUS + HPV, colpo with CIN1  . Allergic rhinitis   . Complication of anesthesia   . Headache(784.0)   . History of nasal polyp   . 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 Hx of migraines    with Aura  . PONV (postoperative nausea and vomiting)     Past Surgical History:  Procedure Laterality Date  . BREAST BIOPSY Right    2016  . BREAST CYST EXCISION  1998   Benign  . BREAST EXCISIONAL BIOPSY    . CESAREAN SECTION  94, 95, 98   x3  . COLPOSCOPY  08/20/12   CIN 1 +HPV  . NASAL POLYP EXCISION     x2  . UTERINE ARTERY EMBOLIZATION  9/09  . WRIST SURGERY Left 2013    Current Outpatient Medications  Medication Sig Dispense Refill  . Cholecalciferol (VITAMIN D3) 10000 units  TABS Take 1 tablet by mouth daily.    . Loratadine (CLARITIN PO) Take by mouth as needed.    . meloxicam (MOBIC) 15 MG tablet Take 1 tablet (15 mg total) by mouth daily. 30 tablet 0  . ondansetron (ZOFRAN ODT) 4 MG disintegrating tablet Take 1 tablet (4 mg total) by mouth every 8 (eight) hours as needed for nausea or vomiting. 20 tablet 0  . SUMAtriptan (IMITREX) 100 MG tablet Take 1 tablet (100 mg total) by mouth every 2 (two) hours as needed for migraine. Max dose is 200mg /24 hours 27 tablet 1   No current facility-administered medications for this visit.    Family History  Problem Relation Age of Onset  . Hyperlipidemia Mother   . Parkinson's disease Mother   . Skin cancer Mother   . Dementia Mother        Lewy body dementia  . Hypertension Father   . Diabetes Father   . Cancer Father        prostate and skin cancer  . Coronary artery disease Father   . Hyperlipidemia Father   . Diabetes Maternal Grandmother   . Hypertension Maternal Grandmother   .  Cancer Maternal Grandfather        renal  . Gout Maternal Grandfather   . Renal cancer Maternal Grandfather   . Hypertension Paternal Grandmother   . Hypertension Paternal Grandfather   . Heart attack Paternal Grandfather 89  . Breast cancer Neg Hx     Review of Systems  Constitutional: Negative.   HENT: Negative.   Eyes: Negative.   Respiratory: Negative.   Cardiovascular: Negative.   Gastrointestinal: Negative.   Endocrine: Negative.   Genitourinary: Negative.   Musculoskeletal: Negative.   Skin: Negative.   Allergic/Immunologic: Negative.   Neurological: Negative.   Hematological: Negative.   Psychiatric/Behavioral: Negative.     Exam:   BP 102/62 (BP Location: Right Arm, Patient Position: Sitting, Cuff Size: Normal)   Pulse 72   Resp 14   Ht 5' 2.75" (1.594 m)   Wt 105 lb (47.6 kg)   LMP 10/08/2012   BMI 18.75 kg/m   Height: 5' 2.75" (159.4 cm)  General appearance: alert, cooperative and appears stated  age Head: Normocephalic, without obvious abnormality, atraumatic Neck: no adenopathy, supple, symmetrical, trachea midline and thyroid normal to inspection and palpation Lungs: clear to auscultation bilaterally Breasts: normal appearance, no masses or tenderness Heart: regular rate and rhythm Abdomen: soft, non-tender; bowel sounds normal; no masses,  no organomegaly Extremities: extremities normal, atraumatic, no cyanosis or edema Skin: Skin color, texture, turgor normal. No rashes or lesions Lymph nodes: Cervical, supraclavicular, and axillary nodes normal. No abnormal inguinal nodes palpated Neurologic: Grossly normal   Pelvic: External genitalia:  no lesions              Urethra:  normal appearing urethra with no masses, tenderness or lesions              Bartholins and Skenes: normal                 Vagina: normal appearing vagina with normal color and discharge, no lesions              Cervix: no lesions              Pap taken: No. Bimanual Exam:  Uterus:  normal size, contour, position, consistency, mobility, non-tender              Adnexa: normal adnexa and no mass, fullness, tenderness               Rectovaginal: Confirms               Anus:  normal sphincter tone, no lesions  Chaperone, Terence Lux, CMA, was present for exam.  A:  Well Woman with normal exam PMP, no HRT H/o migraines with aura H/o calcified fibroids, last PUS 11/16.  [place] 9/09.  P:   Mammogram guidelines reviewed pap smear with neg HR HPV 2020.  Not indicated. Colonoscopy 2016 Consider BMD by age 15. No lab work obtained today Imitrex rx to pharmacy as well as Zofran for nausea when has headaches. return annually or prn

## 2020-06-14 ENCOUNTER — Other Ambulatory Visit: Payer: Self-pay

## 2020-06-14 ENCOUNTER — Ambulatory Visit (INDEPENDENT_AMBULATORY_CARE_PROVIDER_SITE_OTHER): Payer: 59 | Admitting: Obstetrics & Gynecology

## 2020-06-14 ENCOUNTER — Encounter: Payer: Self-pay | Admitting: Obstetrics & Gynecology

## 2020-06-14 ENCOUNTER — Other Ambulatory Visit: Payer: Self-pay | Admitting: Obstetrics & Gynecology

## 2020-06-14 VITALS — BP 102/62 | HR 72 | Resp 14 | Ht 62.75 in | Wt 105.0 lb

## 2020-06-14 DIAGNOSIS — Z01419 Encounter for gynecological examination (general) (routine) without abnormal findings: Secondary | ICD-10-CM | POA: Diagnosis not present

## 2020-06-14 MED ORDER — ONDANSETRON 4 MG PO TBDP: 4.0000 mg | ORAL_TABLET | Freq: Three times a day (TID) | ORAL | 1 refills | Status: DC | PRN

## 2020-06-14 MED ORDER — SUMATRIPTAN SUCCINATE 100 MG PO TABS: 100.0000 mg | ORAL_TABLET | ORAL | 4 refills | Status: DC | PRN

## 2020-06-14 MED FILL — SUMAtriptan SUCCINATE 100 M: 100 | 30 days supply | Qty: 9 | Fill #2

## 2020-08-12 MED FILL — SUMAtriptan SUCCINATE 100 M: 100 | 30 days supply | Qty: 9 | Fill #0

## 2020-09-23 ENCOUNTER — Encounter: Payer: Self-pay | Admitting: Family

## 2020-09-23 ENCOUNTER — Ambulatory Visit (INDEPENDENT_AMBULATORY_CARE_PROVIDER_SITE_OTHER): Payer: 59 | Admitting: Family

## 2020-09-23 ENCOUNTER — Other Ambulatory Visit: Payer: Self-pay

## 2020-09-23 VITALS — BP 108/72 | HR 74 | Temp 98.3°F | Ht 62.75 in | Wt 104.0 lb

## 2020-09-23 DIAGNOSIS — Z13220 Encounter for screening for lipoid disorders: Secondary | ICD-10-CM | POA: Diagnosis not present

## 2020-09-23 DIAGNOSIS — Z0000 Encounter for general adult medical examination without abnormal findings: Secondary | ICD-10-CM

## 2020-09-23 DIAGNOSIS — E559 Vitamin D deficiency, unspecified: Secondary | ICD-10-CM | POA: Diagnosis not present

## 2020-09-23 DIAGNOSIS — E2839 Other primary ovarian failure: Secondary | ICD-10-CM

## 2020-09-23 LAB — CBC WITH DIFFERENTIAL/PLATELET
Basophils Absolute: 0.1 10*3/uL (ref 0.0–0.1)
Basophils Relative: 1 % (ref 0.0–3.0)
Eosinophils Absolute: 0.2 10*3/uL (ref 0.0–0.7)
Eosinophils Relative: 2.9 % (ref 0.0–5.0)
HCT: 40.1 % (ref 36.0–46.0)
Hemoglobin: 13.4 g/dL (ref 12.0–15.0)
Lymphocytes Relative: 30 % (ref 12.0–46.0)
Lymphs Abs: 1.6 10*3/uL (ref 0.7–4.0)
MCHC: 33.5 g/dL (ref 30.0–36.0)
MCV: 87.4 fl (ref 78.0–100.0)
Monocytes Absolute: 0.4 10*3/uL (ref 0.1–1.0)
Monocytes Relative: 7.4 % (ref 3.0–12.0)
Neutro Abs: 3.1 10*3/uL (ref 1.4–7.7)
Neutrophils Relative %: 58.7 % (ref 43.0–77.0)
Platelets: 327 10*3/uL (ref 150.0–400.0)
RBC: 4.58 Mil/uL (ref 3.87–5.11)
RDW: 12.8 % (ref 11.5–15.5)
WBC: 5.2 10*3/uL (ref 4.0–10.5)

## 2020-09-23 LAB — LIPID PANEL
Cholesterol: 215 mg/dL — ABNORMAL HIGH (ref 0–200)
HDL: 77.5 mg/dL (ref >39.00)
LDL Cholesterol: 112 mg/dL — ABNORMAL HIGH (ref 0–99)
NonHDL: 137.06
Total CHOL/HDL Ratio: 3
Triglycerides: 125 mg/dL (ref 0.0–149.0)
VLDL: 25 mg/dL (ref 0.0–40.0)

## 2020-09-23 LAB — COMPREHENSIVE METABOLIC PANEL
ALT: 15 U/L (ref 0–35)
AST: 17 U/L (ref 0–37)
Albumin: 4.4 g/dL (ref 3.5–5.2)
Alkaline Phosphatase: 50 U/L (ref 39–117)
BUN: 14 mg/dL (ref 6–23)
CO2: 28 mEq/L (ref 19–32)
Calcium: 9.5 mg/dL (ref 8.4–10.5)
Chloride: 103 mEq/L (ref 96–112)
Creatinine, Ser: 0.87 mg/dL (ref 0.40–1.20)
GFR: 75 mL/min (ref >60.00)
Glucose, Bld: 82 mg/dL (ref 70–99)
Potassium: 4.1 mEq/L (ref 3.5–5.1)
Sodium: 138 mEq/L (ref 135–145)
Total Bilirubin: 0.6 mg/dL (ref 0.2–1.2)
Total Protein: 7.3 g/dL (ref 6.0–8.3)

## 2020-09-23 LAB — VITAMIN D 25 HYDROXY (VIT D DEFICIENCY, FRACTURES): VITD: 27.8 ng/mL — ABNORMAL LOW (ref 30.00–100.00)

## 2020-09-23 NOTE — Progress Notes (Signed)
Marie Gross is a 54 y.o. female with the following history as recorded in EpicCare:  Patient Active Problem List   Diagnosis Date Noted  . Intramural leiomyoma of uterus 08/28/2015  . Migraine headache 07/18/2007  . NASAL POLYP 07/18/2007  . ALLERGIC RHINITIS 07/18/2007    Current Outpatient Medications  Medication Sig Dispense Refill  . Cholecalciferol (VITAMIN D3) 10000 units TABS Take 1 tablet by mouth daily.    . Loratadine (CLARITIN PO) Take by mouth as needed.    . ondansetron (ZOFRAN ODT) 4 MG disintegrating tablet Take 1 tablet (4 mg total) by mouth every 8 (eight) hours as needed for nausea or vomiting. 20 tablet 1  . SUMAtriptan (IMITREX) 100 MG tablet Take 1 tablet (100 mg total) by mouth every 2 (two) hours as needed for migraine. Max dose is $Remov'200mg'JPIzFe$ /24 hours 27 tablet 4   No current facility-administered medications for this visit.    Allergies: Penicillins, Sulfonamide derivatives, and Tetracycline  Past Medical History:  Diagnosis Date  . Abnormal Pap smear 07/2012   ASCUS + HPV, colpo with CIN1  . Allergic rhinitis   . Complication of anesthesia   . Headache(784.0)   . History of nasal polyp   . 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 Hx of migraines    with Aura  . PONV (postoperative nausea and vomiting)     Past Surgical History:  Procedure Laterality Date  . BREAST BIOPSY Right    2016  . BREAST CYST EXCISION  1998   Benign  . BREAST EXCISIONAL BIOPSY    . CESAREAN SECTION  94, 95, 98   x3  . COLPOSCOPY  08/20/12   CIN 1 +HPV  . NASAL POLYP EXCISION     x2  . UTERINE ARTERY EMBOLIZATION  9/09  . WRIST SURGERY Left 2013    Family History  Problem Relation Age of Onset  . Hyperlipidemia Mother   . Parkinson's disease Mother   . Skin cancer Mother   . Dementia Mother        Lewy body dementia  . Hypertension Father   . Diabetes Father   . Cancer Father        prostate and skin cancer  . Coronary artery disease Father   . Hyperlipidemia Father   . Diabetes  Maternal Grandmother   . Hypertension Maternal Grandmother   . Cancer Maternal Grandfather        renal  . Gout Maternal Grandfather   . Renal cancer Maternal Grandfather   . Hypertension Paternal Grandmother   . Hypertension Paternal Grandfather   . Heart attack Paternal Grandfather 29  . Breast cancer Neg Hx     Social History   Tobacco Use  . Smoking status: Never Smoker  . Smokeless tobacco: Never Used  Substance Use Topics  . Alcohol use: Yes    Alcohol/week: 2.0 standard drinks    Types: 2 Standard drinks or equivalent per week    Subjective:  Patient presents for yearly CPE; in baseline state of health today; up to date on with dental and eye exams; seeing GYN regularly- mammogram is up to date;  Has started to eat meat and is "feeling better." Has been having less problems with migraines;   Review of Systems  Constitutional: Negative.   HENT: Negative.   Eyes: Negative.   Respiratory: Negative.   Cardiovascular: Negative.   Gastrointestinal: Negative.   Genitourinary: Negative.   Musculoskeletal: Negative.   Skin: Negative.   Neurological: Negative.  Endo/Heme/Allergies: Negative.   Psychiatric/Behavioral: Negative.       Objective:  Vitals:   09/23/20 1031  BP: 108/72  Pulse: 74  Temp: 98.3 F (36.8 C)  TempSrc: Oral  SpO2: 98%  Weight: 104 lb (47.2 kg)  Height: 5' 2.75" (1.594 m)    General: Well developed, well nourished, in no acute distress  Skin : Warm and dry.  Head: Normocephalic and atraumatic  Eyes: Sclera and conjunctiva clear; pupils round and reactive to light; extraocular movements intact  Ears: External normal; canals clear; tympanic membranes normal  Oropharynx: Pink, supple. No suspicious lesions  Neck: Supple without thyromegaly, adenopathy  Lungs: Respirations unlabored; clear to auscultation bilaterally without wheeze, rales, rhonchi  CVS exam: normal rate and regular rhythm.  Abdomen: Soft; nontender; nondistended;  normoactive bowel sounds; no masses or hepatosplenomegaly  Musculoskeletal: No deformities; no active joint inflammation  Extremities: No edema, cyanosis, clubbing  Vessels: Symmetric bilaterally  Neurologic: Alert and oriented; speech intact; face symmetrical; moves all extremities well; CNII-XII intact without focal deficit   Assessment:  1. PE (physical exam), annual   2. Ovarian failure   3. Lipid screening   4. Vitamin D deficiency     Plan:  Age appropriate preventive healthcare needs addressed; encouraged regular eye doctor and dental exams; encouraged regular exercise; will update labs and refills as needed today; follow-up to be determined;  This visit occurred during the SARS-CoV-2 public health emergency.  Safety protocols were in place, including screening questions prior to the visit, additional usage of staff PPE, and extensive cleaning of exam room while observing appropriate contact time as indicated for disinfecting solutions.     No follow-ups on file.  Orders Placed This Encounter  Procedures  . DG Bone Density    Standing Status:   Future    Standing Expiration Date:   09/23/2021    Order Specific Question:   Reason for Exam (SYMPTOM  OR DIAGNOSIS REQUIRED)    Answer:   ovarian failure/ vitamin d deficiency    Order Specific Question:   Is the patient pregnant?    Answer:   No    Order Specific Question:   Preferred imaging location?    Answer:   [facility]  . CBC with Differential/Platelet    Standing Status:   Future    Number of Occurrences:   1    Standing Expiration Date:   09/23/2021  . Comp Met (CMET)    Standing Status:   Future    Number of Occurrences:   1    Standing Expiration Date:   09/23/2021  . Lipid panel    Standing Status:   Future    Number of Occurrences:   1    Standing Expiration Date:   09/23/2021  . Vitamin D (25 hydroxy)    Standing Status:   Future    Number of Occurrences:   1    Standing Expiration Date:   09/23/2021     Requested Prescriptions    No prescriptions requested or ordered in this encounter

## 2020-10-18 DIAGNOSIS — L814 Other melanin hyperpigmentation: Secondary | ICD-10-CM | POA: Diagnosis not present

## 2020-10-18 DIAGNOSIS — L905 Scar conditions and fibrosis of skin: Secondary | ICD-10-CM | POA: Diagnosis not present

## 2020-10-18 DIAGNOSIS — D1801 Hemangioma of skin and subcutaneous tissue: Secondary | ICD-10-CM | POA: Diagnosis not present

## 2020-10-19 ENCOUNTER — Other Ambulatory Visit: Payer: Self-pay | Admitting: Family

## 2020-10-19 DIAGNOSIS — Z1231 Encounter for screening mammogram for malignant neoplasm of breast: Secondary | ICD-10-CM

## 2020-11-24 MED FILL — SUMAtriptan SUCCINATE 100 M: 100 | 30 days supply | Qty: 9 | Fill #1

## 2020-12-01 ENCOUNTER — Other Ambulatory Visit: Payer: Self-pay

## 2020-12-01 ENCOUNTER — Ambulatory Visit
Admission: RE | Admit: 2020-12-01 | Discharge: 2020-12-01 | Disposition: A | Discharge status: Home / Self Care | Payer: 59 | Source: Ambulatory Visit | Attending: Family | Admitting: Family

## 2020-12-01 DIAGNOSIS — Z1231 Encounter for screening mammogram for malignant neoplasm of breast: Secondary | ICD-10-CM

## 2021-01-01 IMAGING — MG DIGITAL SCREENING BILAT W/ TOMO W/ CAD
8 series · 8 of 24 positions shown · non-contrast
Comparison: Previous exam(s).

CLINICAL DATA: Screening.

EXAM:
DIGITAL SCREENING BILATERAL MAMMOGRAM WITH TOMO AND CAD

[L CC synth-2D]
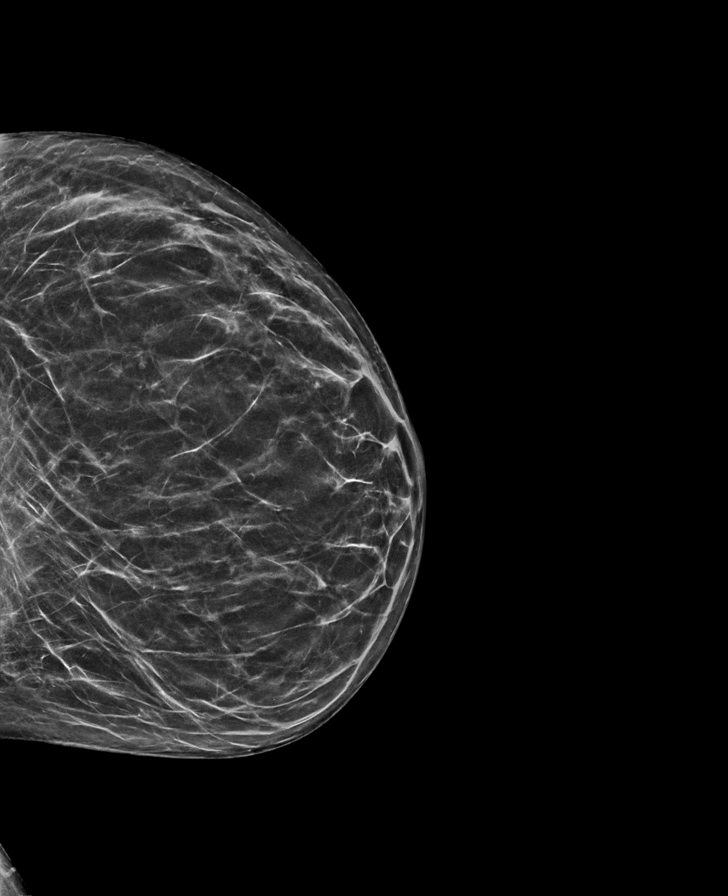

[L MLO synth-2D]
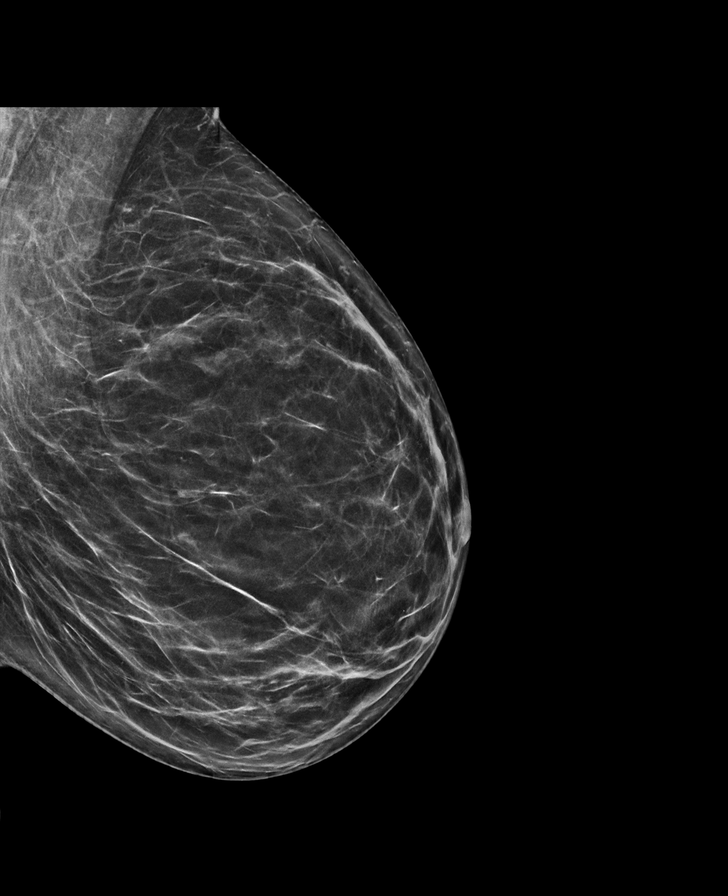

[R MLO synth-2D]
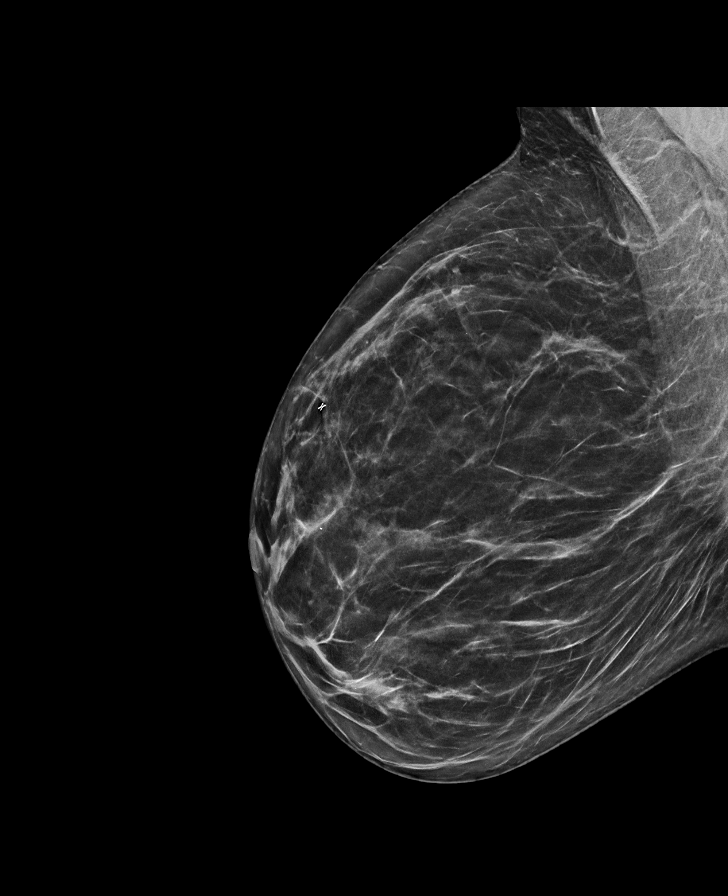

[R CC synth-2D]
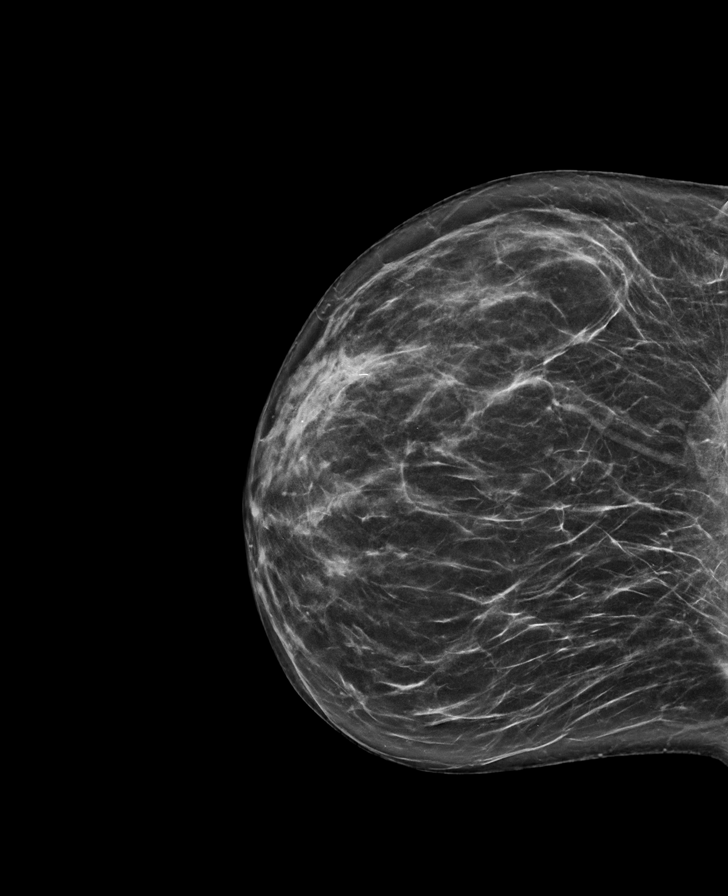

[R CC tomo · tomo slice 31/61.0]
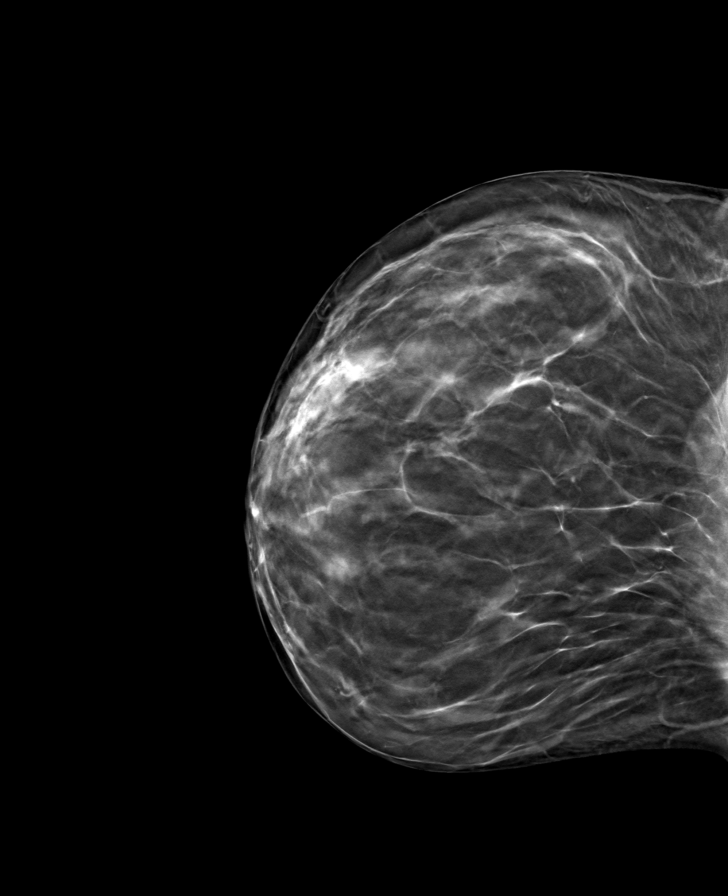

[L MLO tomo · tomo slice 33/64.0]
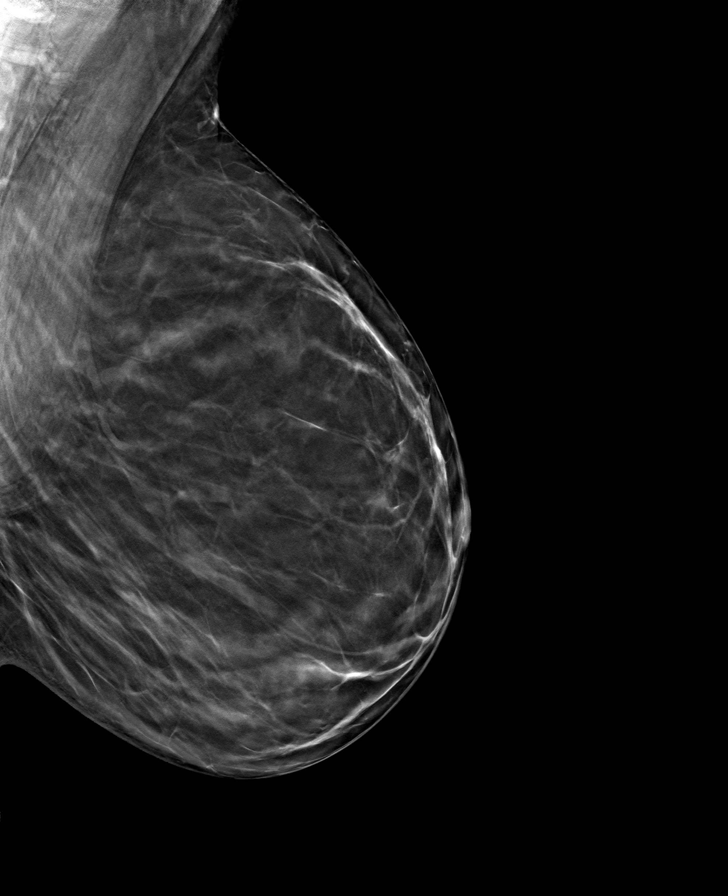

[L CC tomo · tomo slice 30/59.0]
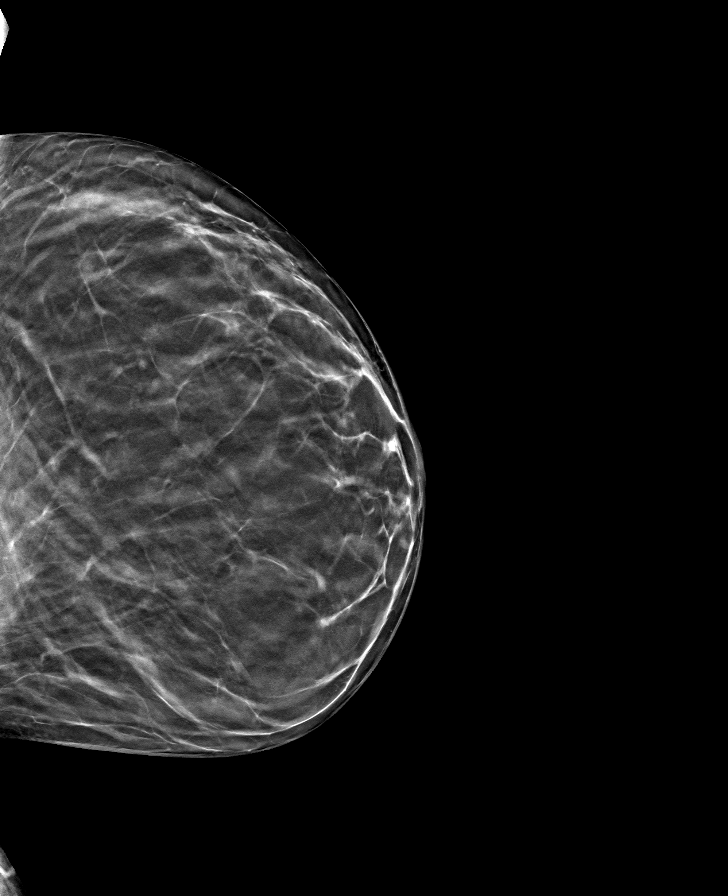

[R MLO tomo · tomo slice 33/65.0]
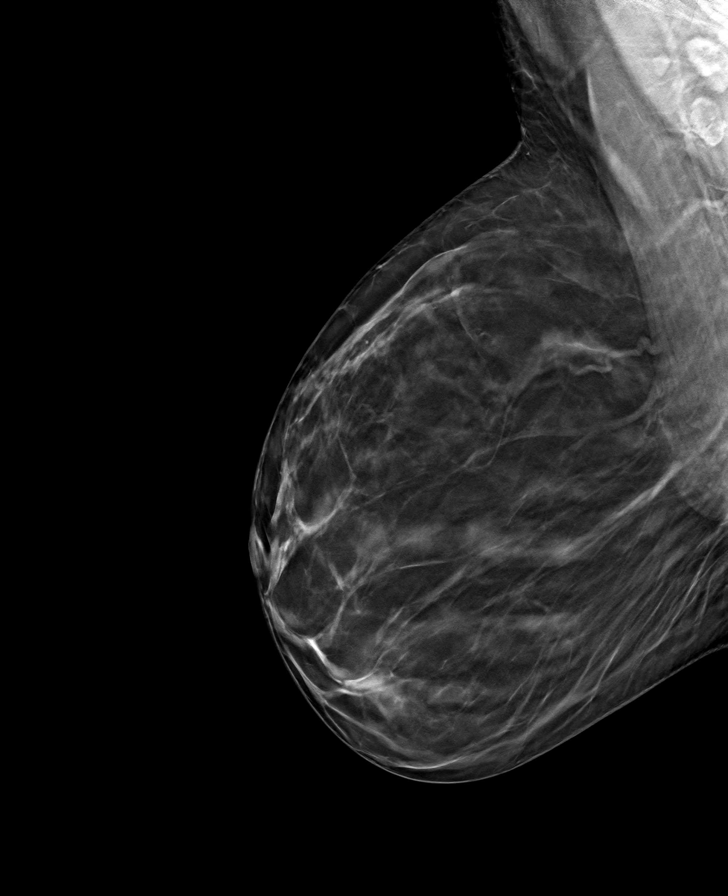

[8 of 24 positions shown; findings below may reference images not displayed]

ACR Breast Density Category c: The breast tissue is heterogeneously
dense, which may obscure small masses.
FINDINGS: There are no findings suspicious for malignancy. Images were
processed with CAD.
IMPRESSION: No mammographic evidence of malignancy. A result letter of this
screening mammogram will be mailed directly to the patient.

RECOMMENDATION:
Screening mammogram in one year. (Code:[id number])

BI-RADS CATEGORY  1: Negative.

## 2021-02-10 ENCOUNTER — Ambulatory Visit
Admission: RE | Admit: 2021-02-10 | Discharge: 2021-02-10 | Disposition: A | Discharge status: Home / Self Care | Payer: 59 | Source: Ambulatory Visit | Attending: Family | Admitting: Family

## 2021-02-10 ENCOUNTER — Other Ambulatory Visit: Payer: Self-pay

## 2021-02-10 DIAGNOSIS — Z13820 Encounter for screening for osteoporosis: Secondary | ICD-10-CM | POA: Diagnosis not present

## 2021-02-10 DIAGNOSIS — E2839 Other primary ovarian failure: Secondary | ICD-10-CM

## 2021-02-10 DIAGNOSIS — Z780 Asymptomatic menopausal state: Secondary | ICD-10-CM | POA: Diagnosis not present

## 2021-03-07 ENCOUNTER — Other Ambulatory Visit (HOSPITAL_COMMUNITY): Payer: Self-pay

## 2021-03-07 MED FILL — Sumatriptan Succinate Tab 100 MG: ORAL | 90 days supply | Qty: 27 | Fill #0 | Status: AC

## 2021-03-09 ENCOUNTER — Other Ambulatory Visit (HOSPITAL_COMMUNITY): Payer: Self-pay

## 2021-05-16 DIAGNOSIS — H5211 Myopia, right eye: Secondary | ICD-10-CM | POA: Diagnosis not present

## 2021-06-25 ENCOUNTER — Telehealth: Payer: Self-pay | Admitting: Obstetrics and Gynecology

## 2021-06-25 DIAGNOSIS — N3000 Acute cystitis without hematuria: Secondary | ICD-10-CM

## 2021-06-25 MED ORDER — NITROFURANTOIN MONOHYD MACRO 100 MG PO CAPS
100.0000 mg | ORAL_CAPSULE | Freq: Two times a day (BID) | ORAL | 0 refills | Status: DC
  Filled 2021-06-25: qty 10, 5d supply, fill #0

## 2021-06-25 MED ORDER — NITROFURANTOIN MONOHYD MACRO 100 MG PO CAPS: 100.0000 mg | ORAL_CAPSULE | Freq: Two times a day (BID) | ORAL | 0 refills | Status: AC

## 2021-06-25 NOTE — Telephone Encounter (Signed)
Patient called answering service. Complained of urgency, frequency and burning with urination. Will treat for UTI. Has sulfa allergy. Prescribed macrorbid 100 BID x5 days. Patient advised to call if symptoms worsen.   Jaquita Folds, MD

## 2021-06-26 ENCOUNTER — Other Ambulatory Visit (HOSPITAL_COMMUNITY): Payer: Self-pay

## 2021-07-21 ENCOUNTER — Ambulatory Visit: Payer: 59 | Attending: Internal Medicine

## 2021-07-21 ENCOUNTER — Other Ambulatory Visit (HOSPITAL_BASED_OUTPATIENT_CLINIC_OR_DEPARTMENT_OTHER): Payer: Self-pay

## 2021-07-21 DIAGNOSIS — Z23 Encounter for immunization: Secondary | ICD-10-CM

## 2021-07-21 MED ORDER — MODERNA COVID-19 BIVAL BOOSTER 50 MCG/0.5ML IM SUSP
INTRAMUSCULAR | 0 refills | Status: DC
  Filled 2021-07-21: qty 0.5, 1d supply, fill #0

## 2021-07-21 NOTE — Progress Notes (Signed)
   Covid-19 Vaccination Clinic  Name:  Marie ABPLANALP    MRN: [id number] DOB: [birth year]  07/21/2021  [name] was observed post Covid-19 immunization for 15 minutes without incident. She was provided with Vaccine Information Sheet and instruction to access the V-Safe system.   [name] was instructed to call 911 with any severe reactions post vaccine: Difficulty breathing  Swelling of face and throat  A fast heartbeat  A bad rash all over body  Dizziness and weakness

## 2021-08-17 DIAGNOSIS — Z113 Encounter for screening for infections with a predominantly sexual mode of transmission: Secondary | ICD-10-CM | POA: Diagnosis not present

## 2021-08-28 ENCOUNTER — Encounter (HOSPITAL_BASED_OUTPATIENT_CLINIC_OR_DEPARTMENT_OTHER): Payer: Self-pay | Admitting: Obstetrics & Gynecology

## 2021-08-28 ENCOUNTER — Other Ambulatory Visit (HOSPITAL_BASED_OUTPATIENT_CLINIC_OR_DEPARTMENT_OTHER): Payer: Self-pay

## 2021-08-28 ENCOUNTER — Other Ambulatory Visit (HOSPITAL_COMMUNITY)
Admission: RE | Admit: 2021-08-28 | Discharge: 2021-08-28 | Disposition: A | Discharge status: Home / Self Care | Payer: 59 | Source: Ambulatory Visit | Attending: Obstetrics & Gynecology | Admitting: Obstetrics & Gynecology

## 2021-08-28 ENCOUNTER — Ambulatory Visit (INDEPENDENT_AMBULATORY_CARE_PROVIDER_SITE_OTHER): Payer: 59 | Admitting: Obstetrics & Gynecology

## 2021-08-28 ENCOUNTER — Other Ambulatory Visit: Payer: Self-pay

## 2021-08-28 VITALS — BP 134/85 | HR 64 | Ht 63.0 in | Wt 102.8 lb

## 2021-08-28 DIAGNOSIS — Z9889 Other specified postprocedural states: Secondary | ICD-10-CM

## 2021-08-28 DIAGNOSIS — Z01419 Encounter for gynecological examination (general) (routine) without abnormal findings: Secondary | ICD-10-CM | POA: Diagnosis not present

## 2021-08-28 DIAGNOSIS — D251 Intramural leiomyoma of uterus: Secondary | ICD-10-CM | POA: Diagnosis not present

## 2021-08-28 DIAGNOSIS — Z124 Encounter for screening for malignant neoplasm of cervix: Secondary | ICD-10-CM

## 2021-08-28 DIAGNOSIS — G43709 Chronic migraine without aura, not intractable, without status migrainosus: Secondary | ICD-10-CM

## 2021-08-28 MED ORDER — ZOSTER VAC RECOMB ADJUVANTED 50 MCG/0.5ML IM SUSR
INTRAMUSCULAR | 1 refills | Status: DC
  Filled 2021-08-28: qty 0.5, 1d supply, fill #0

## 2021-08-28 MED ORDER — ONDANSETRON 4 MG PO TBDP
4.0000 mg | ORAL_TABLET | Freq: Three times a day (TID) | ORAL | 2 refills | Status: DC | PRN
  Filled 2021-08-28: qty 20, 7d supply, fill #0

## 2021-08-28 MED ORDER — SUMATRIPTAN SUCCINATE 100 MG PO TABS
ORAL_TABLET | ORAL | 4 refills | Status: DC
  Filled 2021-08-28: qty 27, 90d supply, fill #0
  Filled 2022-01-30: qty 27, 90d supply, fill #1

## 2021-08-28 NOTE — Progress Notes (Signed)
55 y.o. G3P3 Divorced White or Caucasian female here for annual exam.  Doing well.  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  Denies vaginal bleeding.    Mother passed Oct 31st.  Her father is dad is sad but also relieved.  He is 88.    Is dating someone.    Patient's last menstrual period was 10/08/2012.          Sexually active: Yes.    The current method of family planning is post menopausal status.    Exercising: Yes.    Yoga, walking, HIIT, hiking Smoker:  no  Health Maintenance: Pap:  04/20/2019 Negative History of abnormal Pap:  yes MMG:  12/01/2020 Negative Colonoscopy:  09/29/2015, follow up 10 years BMD:   02/10/2021 Screening Labs: does with Jodi Mourning   reports that she has never smoked. She has never used smokeless tobacco. She reports current alcohol use of about 2.0 standard drinks per week. She reports that she does not use drugs.  Past Medical History:  Diagnosis Date   Abnormal Pap smear 07/2012   ASCUS + HPV, colpo with CIN1   Allergic rhinitis    Complication of anesthesia    Headache(784.0)    History of nasal polyp    HPV exposure 07/2012   Hx of migraines    with Aura   PONV (postoperative nausea and vomiting)     Past Surgical History:  Procedure Laterality Date   BREAST BIOPSY Right    2016   BREAST CYST EXCISION  1998   Benign   BREAST EXCISIONAL BIOPSY     CESAREAN SECTION  94, 95, 98   x3   COLPOSCOPY  08/20/12   CIN 1 +HPV   NASAL POLYP EXCISION     x2   UTERINE ARTERY EMBOLIZATION  9/09   WRIST SURGERY Left 2013    Current Outpatient Medications  Medication Sig Dispense Refill   Cholecalciferol (VITAMIN D3) 10000 units TABS Take 1 tablet by mouth daily.     COVID-19 mRNA bivalent vaccine, Moderna, (MODERNA COVID-19 BIVAL BOOSTER) 50 MCG/0.5ML injection Inject into the muscle. 0.5 mL 0   Loratadine (CLARITIN PO) Take by mouth as needed.     ondansetron (ZOFRAN ODT) 4 MG disintegrating tablet Take 1 tablet (4 mg total) by mouth every 8 (eight)  hours as needed for nausea or vomiting. 20 tablet 1   SUMAtriptan (IMITREX) 100 MG tablet TAKE 1 TABLET BY MOUTH EVERY 2 HOURS AS NEEDED FOR MIGRAINE. MAX DOSE IS 200MG /24 HOURS. 27 tablet 4   No current facility-administered medications for this visit.    Family History  Problem Relation Age of Onset   Hyperlipidemia Mother    Parkinson's disease Mother    Skin cancer Mother    Dementia Mother        Lewy body dementia   Hypertension Father    Diabetes Father    Cancer Father        prostate and skin cancer   Coronary artery disease Father    Hyperlipidemia Father    Diabetes Maternal Grandmother    Hypertension Maternal Grandmother    Cancer Maternal Grandfather        renal   Gout Maternal Grandfather    Renal cancer Maternal Grandfather    Hypertension Paternal Grandmother    Hypertension Paternal Grandfather    Heart attack Paternal Grandfather 68   Breast cancer Neg Hx     Review of Systems  All other systems reviewed and are negative.  Exam:  BP 134/85 (BP Location: Right Arm, Patient Position: Sitting, Cuff Size: Normal)   Pulse 64   Ht 5\' 3"  (1.6 m)   Wt 102 lb 12.8 oz (46.6 kg)   LMP 10/08/2012   BMI 18.21 kg/m   Height: 5\' 3"  (160 cm)  General appearance: alert, cooperative and appears stated age Head: Normocephalic, without obvious abnormality, atraumatic Neck: no adenopathy, supple, symmetrical, trachea midline and thyroid normal to inspection and palpation Lungs: clear to auscultation bilaterally Breasts: normal appearance, no masses or tenderness Heart: regular rate and rhythm Abdomen: soft, non-tender; bowel sounds normal; no masses,  no organomegaly Extremities: extremities normal, atraumatic, no cyanosis or edema Skin: Skin color, texture, turgor normal. No rashes or lesions Lymph nodes: Cervical, supraclavicular, and axillary nodes normal. No abnormal inguinal nodes palpated Neurologic: Grossly normal   Pelvic: External genitalia:  no  lesions              Urethra:  normal appearing urethra with no masses, tenderness or lesions              Bartholins and Skenes: normal                 Vagina: normal appearing vagina with normal color and no discharge, no lesions              Cervix: no lesions              Pap taken: Yes.   Bimanual Exam:  Uterus:  normal size, contour, position, consistency, mobility, non-tender              Adnexa: normal adnexa and no mass, fullness, tenderness               Rectovaginal: Confirms               Anus:  normal sphincter tone, no lesions  Chaperone, Octaviano Batty, CMA, was present for exam.  Assessment/Plan: 1. Well woman exam with routine gynecological exam - pap and and HR HPV  - MMG 11/2020 - colonoscopy 09/2015, follow up 10 years - lab work done with Jodi Mourning - care gaps and vaccinations reviewed/updated  2. Cervical cancer screening - Cytology - PAP( Grenelefe)  3. Chronic migraine without aura without status migrainosus, not intractable - SUMAtriptan (IMITREX) 100 MG tablet; TAKE 1 TABLET BY MOUTH EVERY 2 HOURS AS NEEDED FOR MIGRAINE. MAX DOSE IS 200MG /24 HOURS.  Dispense: 27 tablet; Refill: 4 - ondansetron (ZOFRAN ODT) 4 MG disintegrating tablet; Take 1 tablet (4 mg total) by mouth every 8 (eight) hours as needed for nausea or vomiting.  Dispense: 20 tablet; Refill: 2  4. Intramural leiomyoma of uterus  5. Status post embolization of uterine artery

## 2021-09-04 LAB — CYTOLOGY - PAP
Comment: NEGATIVE
Comment: NEGATIVE
Diagnosis: NEGATIVE
HPV 16: NEGATIVE
HPV 18 / 45: NEGATIVE
High risk HPV: POSITIVE — AB

## 2021-09-26 ENCOUNTER — Ambulatory Visit (INDEPENDENT_AMBULATORY_CARE_PROVIDER_SITE_OTHER): Payer: 59 | Admitting: Family

## 2021-09-26 VITALS — BP 122/80 | HR 69 | Temp 98.1°F | Ht 62.0 in | Wt 104.0 lb

## 2021-09-26 DIAGNOSIS — Z0000 Encounter for general adult medical examination without abnormal findings: Secondary | ICD-10-CM | POA: Diagnosis not present

## 2021-09-26 DIAGNOSIS — E559 Vitamin D deficiency, unspecified: Secondary | ICD-10-CM | POA: Diagnosis not present

## 2021-09-26 DIAGNOSIS — R7309 Other abnormal glucose: Secondary | ICD-10-CM | POA: Diagnosis not present

## 2021-09-26 DIAGNOSIS — Z13220 Encounter for screening for lipoid disorders: Secondary | ICD-10-CM | POA: Diagnosis not present

## 2021-09-26 LAB — CBC WITH DIFFERENTIAL/PLATELET
Basophils Absolute: 0 10*3/uL (ref 0.0–0.1)
Basophils Relative: 0.7 % (ref 0.0–3.0)
Eosinophils Absolute: 0.2 10*3/uL (ref 0.0–0.7)
Eosinophils Relative: 4 % (ref 0.0–5.0)
HCT: 38.6 % (ref 36.0–46.0)
Hemoglobin: 12.7 g/dL (ref 12.0–15.0)
Lymphocytes Relative: 27.7 % (ref 12.0–46.0)
Lymphs Abs: 1.3 10*3/uL (ref 0.7–4.0)
MCHC: 32.9 g/dL (ref 30.0–36.0)
MCV: 89.1 fl (ref 78.0–100.0)
Monocytes Absolute: 0.3 10*3/uL (ref 0.1–1.0)
Monocytes Relative: 7 % (ref 3.0–12.0)
Neutro Abs: 2.8 10*3/uL (ref 1.4–7.7)
Neutrophils Relative %: 60.6 % (ref 43.0–77.0)
Platelets: 294 10*3/uL (ref 150.0–400.0)
RBC: 4.33 Mil/uL (ref 3.87–5.11)
RDW: 13.1 % (ref 11.5–15.5)
WBC: 4.5 10*3/uL (ref 4.0–10.5)

## 2021-09-26 LAB — LIPID PANEL
Cholesterol: 231 mg/dL — ABNORMAL HIGH (ref 0–200)
HDL: 76.5 mg/dL (ref >39.00)
LDL Cholesterol: 140 mg/dL — ABNORMAL HIGH (ref 0–99)
NonHDL: 154.03
Total CHOL/HDL Ratio: 3
Triglycerides: 70 mg/dL (ref 0.0–149.0)
VLDL: 14 mg/dL (ref 0.0–40.0)

## 2021-09-26 LAB — COMPREHENSIVE METABOLIC PANEL
ALT: 13 U/L (ref 0–35)
AST: 17 U/L (ref 0–37)
Albumin: 4.2 g/dL (ref 3.5–5.2)
Alkaline Phosphatase: 45 U/L (ref 39–117)
BUN: 13 mg/dL (ref 6–23)
CO2: 30 mEq/L (ref 19–32)
Calcium: 9.4 mg/dL (ref 8.4–10.5)
Chloride: 103 mEq/L (ref 96–112)
Creatinine, Ser: 0.65 mg/dL (ref 0.40–1.20)
GFR: 98.41 mL/min (ref >60.00)
Glucose, Bld: 96 mg/dL (ref 70–99)
Potassium: 4.1 mEq/L (ref 3.5–5.1)
Sodium: 138 mEq/L (ref 135–145)
Total Bilirubin: 0.5 mg/dL (ref 0.2–1.2)
Total Protein: 6.7 g/dL (ref 6.0–8.3)

## 2021-09-26 LAB — VITAMIN D 25 HYDROXY (VIT D DEFICIENCY, FRACTURES): VITD: 25.93 ng/mL — ABNORMAL LOW (ref 30.00–100.00)

## 2021-09-26 LAB — TSH: TSH: 5.62 u[IU]/mL — ABNORMAL HIGH (ref 0.35–5.50)

## 2021-09-26 LAB — HEMOGLOBIN A1C: Hgb A1c MFr Bld: 5.7 % (ref 4.6–6.5)

## 2021-09-26 NOTE — Progress Notes (Signed)
Marie Gross is a 55 y.o. female with the following history as recorded in EpicCare:  Patient Active Problem List   Diagnosis Date Noted   Intramural leiomyoma of uterus 08/28/2015   Migraine headache 07/18/2007   NASAL POLYP 07/18/2007   ALLERGIC RHINITIS 07/18/2007    Current Outpatient Medications  Medication Sig Dispense Refill   Cholecalciferol (VITAMIN D3) 10000 units TABS Take 1 tablet by mouth daily.     Loratadine (CLARITIN PO) Take by mouth as needed.     ondansetron (ZOFRAN ODT) 4 MG disintegrating tablet Take 1 tablet (4 mg total) by mouth every 8 (eight) hours as needed for nausea or vomiting. 20 tablet 2   SUMAtriptan (IMITREX) 100 MG tablet TAKE 1 TABLET BY MOUTH EVERY 2 HOURS AS NEEDED FOR MIGRAINE. MAX DOSE IS $Remov'200MG'smBFaL$ /24 HOURS. 27 tablet 4   No current facility-administered medications for this visit.    Allergies: Penicillins, Sulfonamide derivatives, and Tetracycline  Past Medical History:  Diagnosis Date   Abnormal Pap smear 07/2012   ASCUS + HPV, colpo with CIN1   Allergic rhinitis    Complication of anesthesia    Headache(784.0)    History of nasal polyp    HPV exposure 07/2012   Hx of migraines    with Aura   PONV (postoperative nausea and vomiting)     Past Surgical History:  Procedure Laterality Date   BREAST BIOPSY Right    2016   BREAST CYST EXCISION  1998   Benign   BREAST EXCISIONAL BIOPSY     CESAREAN SECTION  94, 95, 98   x3   COLPOSCOPY  08/20/12   CIN 1 +HPV   NASAL POLYP EXCISION     x2   UTERINE ARTERY EMBOLIZATION  9/09   WRIST SURGERY Left 2013    Family History  Problem Relation Age of Onset   Hyperlipidemia Mother    Parkinson's disease Mother    Skin cancer Mother    Dementia Mother        Lewy body dementia   Hypertension Father    Diabetes Father    Cancer Father        prostate and skin cancer   Coronary artery disease Father    Hyperlipidemia Father    Diabetes Maternal Grandmother    Hypertension Maternal  Grandmother    Cancer Maternal Grandfather        renal   Gout Maternal Grandfather    Renal cancer Maternal Grandfather    Hypertension Paternal Grandmother    Hypertension Paternal Grandfather    Heart attack Paternal Grandfather 31   Breast cancer Neg Hx     Social History   Tobacco Use   Smoking status: Never   Smokeless tobacco: Never  Substance Use Topics   Alcohol use: Yes    Alcohol/week: 2.0 standard drinks    Types: 2 Standard drinks or equivalent per week    Subjective:  Patient presents for yearly CPE; up to date on GYN exam; up to date on dentist and eye exam;  Has been monitoring her blood pressure- concerned that is higher than "normal" for her; admits that she is eating more pork; Migraines have been better recently;  Health Maintenance  Topic Date Due   Pneumococcal Vaccine 33-11 Years old (1 - PCV) Never done   HIV Screening  Never done   Zoster Vaccines- Shingrix (2 of 2) 10/23/2021   MAMMOGRAM  12/01/2022   PAP SMEAR-Modifier  08/28/2024   COLONOSCOPY (Pts 45-51yrs  Insurance coverage will need to be confirmed)  09/28/2025   TETANUS/TDAP  04/14/2026   INFLUENZA VACCINE  Completed   COVID-19 Vaccine  Completed   Hepatitis C Screening  Completed   HPV VACCINES  Aged Out     Review of Systems  Constitutional: Negative.   HENT: Negative.    Eyes: Negative.   Respiratory: Negative.    Cardiovascular: Negative.   Gastrointestinal: Negative.   Genitourinary: Negative.   Musculoskeletal: Negative.   Skin: Negative.   Neurological: Negative.   Endo/Heme/Allergies: Negative.   Psychiatric/Behavioral: Negative.         Objective:  Vitals:   09/26/21 0846  BP: 122/80  Pulse: 69  Temp: 98.1 F (36.7 C)  TempSrc: Oral  SpO2: 98%  Weight: 104 lb (47.2 kg)  Height: $Remove'5\' 2"'ENnlYEK$  (1.575 m)    General: Well developed, well nourished, in no acute distress  Skin : Warm and dry.  Head: Normocephalic and atraumatic  Eyes: Sclera and conjunctiva clear;  pupils round and reactive to light; extraocular movements intact  Ears: External normal; canals clear; tympanic membranes normal  Oropharynx: Pink, supple. No suspicious lesions  Neck: Supple without thyromegaly, adenopathy  Lungs: Respirations unlabored; clear to auscultation bilaterally without wheeze, rales, rhonchi  CVS exam: normal rate and regular rhythm.  Abdomen: Soft; nontender; nondistended; normoactive bowel sounds; no masses or hepatosplenomegaly  Musculoskeletal: No deformities; no active joint inflammation  Extremities: No edema, cyanosis, clubbing  Vessels: Symmetric bilaterally  Neurologic: Alert and oriented; speech intact; face symmetrical; moves all extremities well; CNII-XII intact without focal deficit  Assessment:  1. PE (physical exam), annual   2. Lipid screening   3. Vitamin D deficiency   4. Elevated glucose     Plan:  Age appropriate preventive healthcare needs addressed; encouraged regular eye doctor and dental exams; encouraged regular exercise; will update labs and refills as needed today; follow-up to be determined;  This visit occurred during the SARS-CoV-2 public health emergency.  Safety protocols were in place, including screening questions prior to the visit, additional usage of staff PPE, and extensive cleaning of exam room while observing appropriate contact time as indicated for disinfecting solutions.    No follow-ups on file.  Orders Placed This Encounter  Procedures   CBC with Differential/Platelet   Comp Met (CMET)   Lipid panel   TSH   Vitamin D (25 hydroxy)   Hemoglobin A1c    Requested Prescriptions    No prescriptions requested or ordered in this encounter

## 2021-09-28 ENCOUNTER — Other Ambulatory Visit: Payer: Self-pay | Admitting: Family

## 2021-09-28 DIAGNOSIS — R7989 Other specified abnormal findings of blood chemistry: Secondary | ICD-10-CM

## 2021-10-06 ENCOUNTER — Other Ambulatory Visit (HOSPITAL_BASED_OUTPATIENT_CLINIC_OR_DEPARTMENT_OTHER): Payer: Self-pay

## 2021-10-16 ENCOUNTER — Other Ambulatory Visit (HOSPITAL_COMMUNITY): Payer: Self-pay

## 2021-10-16 ENCOUNTER — Telehealth: Payer: Self-pay | Admitting: Family

## 2021-10-16 MED ORDER — VITAMIN D (ERGOCALCIFEROL) 1.25 MG (50000 UNIT) PO CAPS
50000.0000 [IU] | ORAL_CAPSULE | ORAL | 0 refills | Status: DC
  Filled 2021-10-16 – 2021-10-19 (×2): qty 12, 84d supply, fill #0

## 2021-10-16 NOTE — Telephone Encounter (Signed)
Pt called regarding meds listed on discharge summary on 12/20. Pt stated she never received the vitamin d3 meds. Please advise.

## 2021-10-16 NOTE — Telephone Encounter (Signed)
Left detailed message that vit d 50000 has been sent in.

## 2021-10-18 ENCOUNTER — Other Ambulatory Visit: Payer: Self-pay | Admitting: Family

## 2021-10-18 DIAGNOSIS — Z1231 Encounter for screening mammogram for malignant neoplasm of breast: Secondary | ICD-10-CM

## 2021-10-19 ENCOUNTER — Other Ambulatory Visit (HOSPITAL_BASED_OUTPATIENT_CLINIC_OR_DEPARTMENT_OTHER): Payer: Self-pay

## 2021-12-01 ENCOUNTER — Ambulatory Visit
Admission: RE | Admit: 2021-12-01 | Discharge: 2021-12-01 | Disposition: A | Discharge status: Home / Self Care | Payer: 59 | Source: Ambulatory Visit | Attending: Family | Admitting: Family

## 2021-12-01 ENCOUNTER — Other Ambulatory Visit: Payer: Self-pay

## 2021-12-01 DIAGNOSIS — Z1231 Encounter for screening mammogram for malignant neoplasm of breast: Secondary | ICD-10-CM

## 2021-12-08 ENCOUNTER — Ambulatory Visit
Admission: RE | Admit: 2021-12-08 | Discharge: 2021-12-08 | Disposition: A | Discharge status: Home / Self Care | Payer: 59 | Source: Ambulatory Visit | Attending: Family | Admitting: Family

## 2021-12-08 ENCOUNTER — Other Ambulatory Visit: Payer: Self-pay

## 2021-12-08 DIAGNOSIS — Z1231 Encounter for screening mammogram for malignant neoplasm of breast: Secondary | ICD-10-CM | POA: Diagnosis not present

## 2021-12-15 ENCOUNTER — Other Ambulatory Visit (HOSPITAL_BASED_OUTPATIENT_CLINIC_OR_DEPARTMENT_OTHER): Payer: Self-pay

## 2021-12-15 MED ORDER — ZOSTER VAC RECOMB ADJUVANTED 50 MCG/0.5ML IM SUSR
INTRAMUSCULAR | 0 refills | Status: DC
  Filled 2021-12-15: qty 0.5, 1d supply, fill #0

## 2022-01-30 ENCOUNTER — Other Ambulatory Visit (HOSPITAL_BASED_OUTPATIENT_CLINIC_OR_DEPARTMENT_OTHER): Payer: Self-pay

## 2022-03-08 ENCOUNTER — Other Ambulatory Visit (HOSPITAL_BASED_OUTPATIENT_CLINIC_OR_DEPARTMENT_OTHER): Payer: Self-pay

## 2022-06-04 ENCOUNTER — Other Ambulatory Visit (HOSPITAL_BASED_OUTPATIENT_CLINIC_OR_DEPARTMENT_OTHER): Payer: Self-pay

## 2022-07-02 ENCOUNTER — Other Ambulatory Visit (HOSPITAL_BASED_OUTPATIENT_CLINIC_OR_DEPARTMENT_OTHER): Payer: Self-pay

## 2022-07-02 ENCOUNTER — Encounter: Payer: Self-pay | Admitting: Family Medicine

## 2022-07-02 ENCOUNTER — Telehealth (INDEPENDENT_AMBULATORY_CARE_PROVIDER_SITE_OTHER): Payer: BC Managed Care – PPO | Admitting: Family Medicine

## 2022-07-02 DIAGNOSIS — J988 Other specified respiratory disorders: Secondary | ICD-10-CM | POA: Diagnosis not present

## 2022-07-02 DIAGNOSIS — U071 COVID-19: Secondary | ICD-10-CM

## 2022-07-02 MED ORDER — MOLNUPIRAVIR EUA 200MG CAPSULE
4.0000 | ORAL_CAPSULE | Freq: Two times a day (BID) | ORAL | 0 refills | Status: AC
Start: 2022-07-02 — End: 2022-07-07
  Filled 2022-07-02: qty 40, 5d supply, fill #0

## 2022-07-02 MED ORDER — ALBUTEROL SULFATE HFA 108 (90 BASE) MCG/ACT IN AERS
2.0000 | INHALATION_SPRAY | Freq: Four times a day (QID) | RESPIRATORY_TRACT | 0 refills | Status: DC | PRN
  Filled 2022-07-02: qty 6.7, 25d supply, fill #0

## 2022-07-02 NOTE — Progress Notes (Signed)
Virtual Visit via Video Note  I connected with pt on 07/02/22 at 10:40 AM EDT by a video enabled telemedicine application and verified that I am speaking with the correct person using two identifiers.  Location patient: [place] Location provider:work or home office Persons participating in the virtual visit: patient, provider  I discussed the limitations and requested verbal permission for telemedicine visit. The patient expressed understanding and agreed to proceed.  CC: "cough"  HPI: 56 year old female being seen today for cough. Onset 2 to 3 days ago.  Started with sore throat but then progressed to fever and cough and flulike body aches. No nausea vomiting or diarrhea. She does feel like her chest is tight but has no shortness of breath, wheezing, or chest pain.  Home O2 to sats 95% or greater. Home COVID test 2 days ago was positive.  ROS: See pertinent positives and negatives per HPI.  Past Medical History:  Diagnosis Date   Abnormal Pap smear 07/2012   ASCUS + HPV, colpo with CIN1   Allergic rhinitis    Complication of anesthesia    Headache(784.0)    History of nasal polyp    HPV exposure 07/2012   Hx of migraines    with Aura   PONV (postoperative nausea and vomiting)     Past Surgical History:  Procedure Laterality Date   BREAST BIOPSY Right    2016   BREAST CYST EXCISION  1998   Benign   BREAST EXCISIONAL BIOPSY     CESAREAN SECTION  94, 95, 98   x3   COLPOSCOPY  08/20/12   CIN 1 +HPV   NASAL POLYP EXCISION     x2   UTERINE ARTERY EMBOLIZATION  9/09   WRIST SURGERY Left 2013     Current Outpatient Medications:    albuterol (VENTOLIN HFA) 108 (90 Base) MCG/ACT inhaler, Inhale 2 puffs into the lungs every 6 (six) hours as needed for wheezing or shortness of breath., Disp: 6.7 g, Rfl: 0   molnupiravir EUA (LAGEVRIO) 200 mg CAPS capsule, Take 4 capsules (800 mg total) by mouth 2 (two) times daily for 5 days., Disp: 40 capsule, Rfl: 0   Cholecalciferol (VITAMIN  D3) 10000 units TABS, Take 1 tablet by mouth daily., Disp: , Rfl:    Loratadine (CLARITIN PO), Take by mouth as needed., Disp: , Rfl:    ondansetron (ZOFRAN ODT) 4 MG disintegrating tablet, Take 1 tablet (4 mg total) by mouth every 8 (eight) hours as needed for nausea or vomiting., Disp: 20 tablet, Rfl: 2   SUMAtriptan (IMITREX) 100 MG tablet, TAKE 1 TABLET BY MOUTH EVERY 2 HOURS AS NEEDED FOR MIGRAINE. MAX DOSE IS '200MG'$ /24 HOURS., Disp: 27 tablet, Rfl: 4   Vitamin D, Ergocalciferol, (DRISDOL) 1.25 MG (50000 UNIT) CAPS capsule, Take 1 capsule (50,000 Units total) by mouth every 7 (seven) days., Disp: 12 capsule, Rfl: 0   Zoster Vaccine Adjuvanted [facility]) injection, Inject into the muscle., Disp: 0.5 mL, Rfl: 0  EXAM:  VITALS per patient if applicable:     01/74/9449    8:46 AM 08/28/2021   10:37 AM 09/23/2020   10:31 AM  Vitals with BMI  Height '5\' 2"'$  '5\' 3"'$  5' 2.75"  Weight 104 lbs 102 lbs 13 oz 104 lbs  BMI 19.02 67.59 16.38  Systolic 466 599 357  Diastolic 80 85 72  Pulse 69 64 74    GENERAL: alert, oriented, appears well and in no acute distress  HEENT: atraumatic, conjunttiva clear, no obvious abnormalities  on inspection of external nose and ears  NECK: normal movements of the head and neck  LUNGS: on inspection no signs of respiratory distress, breathing rate appears normal, no obvious gross SOB, gasping or wheezing  CV: no obvious cyanosis  MS: moves all visible extremities without noticeable abnormality  PSYCH/NEURO: pleasant and cooperative, no obvious depression or anxiety, speech and thought processing grossly intact  LABS: none today    Chemistry      Component Value Date/Time   NA 138 09/26/2021 0919   NA 139 04/20/2019 1030   K 4.1 09/26/2021 0919   CL 103 09/26/2021 0919   CO2 30 09/26/2021 0919   BUN 13 09/26/2021 0919   BUN 9 04/20/2019 1030   CREATININE 0.65 09/26/2021 0919   CREATININE 0.54 08/12/2015 1437      Component Value Date/Time    CALCIUM 9.4 09/26/2021 0919   ALKPHOS 45 09/26/2021 0919   AST 17 09/26/2021 0919   ALT 13 09/26/2021 0919   BILITOT 0.5 09/26/2021 0919   BILITOT 0.4 04/20/2019 1030     ASSESSMENT AND PLAN:  Discussed the following assessment and plan:  COVID-19 respiratory illness. Risk factor for complications from COVID is age >51. Albuterol 1 to 2 puffs every 6 hour as needed prescribed today. Additionally, molnupiravir x5 days prescribed.   I discussed the assessment and treatment plan with the patient. The patient was provided an opportunity to ask questions and all were answered. The patient agreed with the plan and demonstrated an understanding of the instructions.   F/u: Not improved significantly in 4 to 5 days.  Signed:  Crissie Sickles, MD           07/02/2022

## 2022-07-20 ENCOUNTER — Other Ambulatory Visit (HOSPITAL_BASED_OUTPATIENT_CLINIC_OR_DEPARTMENT_OTHER): Payer: Self-pay

## 2022-09-03 ENCOUNTER — Other Ambulatory Visit (HOSPITAL_COMMUNITY): Payer: Self-pay

## 2022-09-03 ENCOUNTER — Ambulatory Visit (INDEPENDENT_AMBULATORY_CARE_PROVIDER_SITE_OTHER): Payer: BC Managed Care – PPO | Admitting: Obstetrics & Gynecology

## 2022-09-03 ENCOUNTER — Other Ambulatory Visit (HOSPITAL_COMMUNITY)
Admission: RE | Admit: 2022-09-03 | Discharge: 2022-09-03 | Disposition: A | Discharge status: Home / Self Care | Payer: BC Managed Care – PPO | Source: Ambulatory Visit | Attending: Obstetrics & Gynecology | Admitting: Obstetrics & Gynecology

## 2022-09-03 ENCOUNTER — Encounter (HOSPITAL_BASED_OUTPATIENT_CLINIC_OR_DEPARTMENT_OTHER): Payer: Self-pay | Admitting: Obstetrics & Gynecology

## 2022-09-03 VITALS — BP 129/80 | HR 69 | Ht 62.5 in | Wt 114.2 lb

## 2022-09-03 DIAGNOSIS — D251 Intramural leiomyoma of uterus: Secondary | ICD-10-CM | POA: Diagnosis not present

## 2022-09-03 DIAGNOSIS — B977 Papillomavirus as the cause of diseases classified elsewhere: Secondary | ICD-10-CM | POA: Insufficient documentation

## 2022-09-03 DIAGNOSIS — Z124 Encounter for screening for malignant neoplasm of cervix: Secondary | ICD-10-CM | POA: Diagnosis not present

## 2022-09-03 DIAGNOSIS — N898 Other specified noninflammatory disorders of vagina: Secondary | ICD-10-CM | POA: Diagnosis not present

## 2022-09-03 DIAGNOSIS — Z01419 Encounter for gynecological examination (general) (routine) without abnormal findings: Secondary | ICD-10-CM | POA: Diagnosis not present

## 2022-09-03 DIAGNOSIS — G43709 Chronic migraine without aura, not intractable, without status migrainosus: Secondary | ICD-10-CM

## 2022-09-03 MED ORDER — SUMATRIPTAN SUCCINATE 100 MG PO TABS
100.0000 mg | ORAL_TABLET | ORAL | 4 refills | Status: AC | PRN
Start: 2022-09-03 — End: ?
  Filled 2022-09-03 – 2023-03-02 (×4): qty 18, 30d supply, fill #0
  Filled 2023-05-27 – 2023-06-01 (×2): qty 18, 30d supply, fill #1

## 2022-09-03 NOTE — Progress Notes (Signed)
56 y.o. G3P3 Divorced White or Caucasian female here for annual exam.  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  Has been a good change for her.  Denies vaginal bleeding.  Had +HR HPV testing on pap last year.  Needs repeat testing today.  Had Covid September and it was severe.  Went on [employer].  Father passed right around the same time.  Had some rectal sores at the same time.  She took a picture of the finding.  Has fully resolved.  Is with same partner.  Together 3+ years.  Patient's last menstrual period was 10/08/2012.          Sexually active: Yes.    The current method of family planning is post menopausal status.    Smoker:  no  Health Maintenance: Pap:  08/28/2021 Positive High Risk HPV History of abnormal Pap:  yes MMG:  12/08/2021 Negative Colonoscopy:  10/03/2015, follow up 10 years BMD:   02/10/2021 Normal Screening Labs: 09/2021 with Jodi Mourning   reports that she has never smoked. She has never used smokeless tobacco. She reports current alcohol use of about 2.0 standard drinks of alcohol per week. She reports that she does not use drugs.  Past Medical History:  Diagnosis Date   Abnormal Pap smear 07/2012   ASCUS + HPV, colpo with CIN1   Allergic rhinitis    Complication of anesthesia    Headache(784.0)    History of nasal polyp    HPV exposure 07/2012   Hx of migraines    with Aura   PONV (postoperative nausea and vomiting)     Past Surgical History:  Procedure Laterality Date   BREAST BIOPSY Right    2016   BREAST CYST EXCISION  1998   Benign   BREAST EXCISIONAL BIOPSY     CESAREAN SECTION  94, 95, 98   x3   COLPOSCOPY  08/20/12   CIN 1 +HPV   NASAL POLYP EXCISION     x2   UTERINE ARTERY EMBOLIZATION  9/09   WRIST SURGERY Left 2013    Current Outpatient Medications  Medication Sig Dispense Refill   Cholecalciferol (VITAMIN D3) 10000 units TABS Take 1 tablet by mouth daily.     Loratadine (CLARITIN PO) Take by mouth  as needed.     ondansetron (ZOFRAN ODT) 4 MG disintegrating tablet Take 1 tablet (4 mg total) by mouth every 8 (eight) hours as needed for nausea or vomiting. 20 tablet 2   Vitamin D, Ergocalciferol, (DRISDOL) 1.25 MG (50000 UNIT) CAPS capsule Take 1 capsule (50,000 Units total) by mouth every 7 (seven) days. 12 capsule 0   Zoster Vaccine Adjuvanted [facility]) injection Inject into the muscle. 0.5 mL 0   SUMAtriptan (IMITREX) 100 MG tablet TAKE 1 TABLET BY MOUTH EVERY 2 HOURS AS NEEDED FOR MIGRAINE. MAX DOSE IS '200MG'$ /24 HOURS. 27 tablet 4   No current facility-administered medications for this visit.    Family History  Problem Relation Age of Onset   Breast cancer Mother        26s   Hyperlipidemia Mother    Parkinson's disease Mother    Skin cancer Mother    Dementia Mother        Lewy body dementia   Hypertension Father    Diabetes Father    Cancer Father        prostate and skin cancer   Coronary artery disease Father    Hyperlipidemia Father  Diabetes Maternal Grandmother    Hypertension Maternal Grandmother    Cancer Maternal Grandfather        renal   Gout Maternal Grandfather    Renal cancer Maternal Grandfather    Hypertension Paternal Grandmother    Hypertension Paternal Grandfather    Heart attack Paternal Grandfather 3    ROS: Constitutional: negative Genitourinary:negative  Exam:   BP 129/80 (BP Location: Right Arm, Patient Position: Sitting, Cuff Size: Normal)   Pulse 69   Ht 5' 2.5" (1.588 m) Comment: Reported  Wt 114 lb 3.2 oz (51.8 kg)   LMP 10/08/2012   BMI 20.55 kg/m   Height: 5' 2.5" (158.8 cm) (Reported)  General appearance: alert, cooperative and appears stated age Head: Normocephalic, without obvious abnormality, atraumatic Neck: no adenopathy, supple, symmetrical, trachea midline and thyroid normal to inspection and palpation Lungs: clear to auscultation bilaterally Breasts: normal appearance, no masses or tenderness Heart: regular rate  and rhythm Abdomen: soft, non-tender; bowel sounds normal; no masses,  no organomegaly Extremities: extremities normal, atraumatic, no cyanosis or edema Skin: Skin color, texture, turgor normal. No rashes or lesions Lymph nodes: Cervical, supraclavicular, and axillary nodes normal. No abnormal inguinal nodes palpated Neurologic: Grossly normal   Pelvic: External genitalia:  no lesions              Urethra:  normal appearing urethra with no masses, tenderness or lesions              Bartholins and Skenes: normal                 Vagina: mild atrophic changes noted, yellowish vaginal noted              Cervix: no lesions              Pap taken: Yes.   Bimanual Exam:  Uterus:  normal size, contour, position, consistency, mobility, non-tender              Adnexa: normal adnexa and no mass, fullness, tenderness               Rectovaginal: Confirms               Anus:  normal sphincter tone, no lesions  Chaperone, Octaviano Batty, CMA, was present for exam.  Assessment/Plan: 1. Well woman exam with routine gynecological exam - Pap smear obtained today - Mammogram 12/2021 - Colonoscop2016 - Bone mineral density 02/10/2021 - lab work done with PCP, Jodi Mourning - vaccines reviewed/updated  2. High risk HPV infection - Cytology - PAP( [place])  3. Cervical cancer screening  4. 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 Chronic migraine without aura without status migrainosus, not intractable - SUMAtriptan (IMITREX) 100 MG tablet; Take 1 tablet (100 mg total) by mouth every 2 (two) hours as needed for migraine. Max dosage is '200mg'$  in 24 hours  Dispense: 27 tablet; Refill: 4  6.  Vaginal discharge - vaginitis testing obtained.  If negative, will give suggestions for treatment for atrophic changes.

## 2022-09-04 LAB — CERVICOVAGINAL ANCILLARY ONLY
Bacterial Vaginitis (gardnerella): POSITIVE — AB
Candida Glabrata: NEGATIVE
Candida Vaginitis: NEGATIVE
Chlamydia: NEGATIVE
Comment: NEGATIVE
Comment: NEGATIVE
Comment: NEGATIVE
Comment: NEGATIVE
Comment: NEGATIVE
Comment: NORMAL
Neisseria Gonorrhea: NEGATIVE
Trichomonas: NEGATIVE

## 2022-09-05 ENCOUNTER — Other Ambulatory Visit (HOSPITAL_BASED_OUTPATIENT_CLINIC_OR_DEPARTMENT_OTHER): Payer: Self-pay

## 2022-09-05 DIAGNOSIS — B9689 Other specified bacterial agents as the cause of diseases classified elsewhere: Secondary | ICD-10-CM

## 2022-09-05 LAB — CYTOLOGY - PAP
Comment: NEGATIVE
Diagnosis: NEGATIVE
High risk HPV: NEGATIVE

## 2022-09-05 MED ORDER — METRONIDAZOLE 0.75 % EX GEL
CUTANEOUS | 0 refills | Status: DC
  Filled 2022-09-05: qty 45, 5d supply, fill #0

## 2022-09-06 ENCOUNTER — Other Ambulatory Visit (HOSPITAL_COMMUNITY): Payer: Self-pay

## 2022-09-07 ENCOUNTER — Other Ambulatory Visit (HOSPITAL_BASED_OUTPATIENT_CLINIC_OR_DEPARTMENT_OTHER): Payer: Self-pay

## 2022-09-07 MED ORDER — METRONIDAZOLE 0.75 % VA GEL
VAGINAL | 0 refills | Status: DC
  Filled 2022-09-07: qty 70, 5d supply, fill #0

## 2022-09-27 ENCOUNTER — Encounter: Payer: Self-pay | Admitting: Family

## 2022-09-27 ENCOUNTER — Ambulatory Visit (INDEPENDENT_AMBULATORY_CARE_PROVIDER_SITE_OTHER): Payer: BC Managed Care – PPO | Admitting: Family

## 2022-09-27 VITALS — BP 116/72 | HR 72 | Temp 98.3°F | Resp 18 | Ht 62.5 in | Wt 113.2 lb

## 2022-09-27 DIAGNOSIS — R899 Unspecified abnormal finding in specimens from other organs, systems and tissues: Secondary | ICD-10-CM | POA: Diagnosis not present

## 2022-09-27 DIAGNOSIS — Z13220 Encounter for screening for lipoid disorders: Secondary | ICD-10-CM | POA: Diagnosis not present

## 2022-09-27 DIAGNOSIS — Z0000 Encounter for general adult medical examination without abnormal findings: Secondary | ICD-10-CM

## 2022-09-27 DIAGNOSIS — E559 Vitamin D deficiency, unspecified: Secondary | ICD-10-CM | POA: Diagnosis not present

## 2022-09-27 LAB — COMPREHENSIVE METABOLIC PANEL
ALT: 13 U/L (ref 0–35)
AST: 15 U/L (ref 0–37)
Albumin: 4.4 g/dL (ref 3.5–5.2)
Alkaline Phosphatase: 47 U/L (ref 39–117)
BUN: 12 mg/dL (ref 6–23)
CO2: 29 mEq/L (ref 19–32)
Calcium: 9.5 mg/dL (ref 8.4–10.5)
Chloride: 103 mEq/L (ref 96–112)
Creatinine, Ser: 0.67 mg/dL (ref 0.40–1.20)
GFR: 97.01 mL/min (ref >60.00)
Glucose, Bld: 103 mg/dL — ABNORMAL HIGH (ref 70–99)
Potassium: 4.5 mEq/L (ref 3.5–5.1)
Sodium: 140 mEq/L (ref 135–145)
Total Bilirubin: 0.6 mg/dL (ref 0.2–1.2)
Total Protein: 6.9 g/dL (ref 6.0–8.3)

## 2022-09-27 LAB — CBC WITH DIFFERENTIAL/PLATELET
Basophils Absolute: 0 10*3/uL (ref 0.0–0.1)
Basophils Relative: 0.6 % (ref 0.0–3.0)
Eosinophils Absolute: 0.2 10*3/uL (ref 0.0–0.7)
Eosinophils Relative: 3.4 % (ref 0.0–5.0)
HCT: 40.9 % (ref 36.0–46.0)
Hemoglobin: 13.5 g/dL (ref 12.0–15.0)
Lymphocytes Relative: 25.6 % (ref 12.0–46.0)
Lymphs Abs: 1.3 10*3/uL (ref 0.7–4.0)
MCHC: 33.1 g/dL (ref 30.0–36.0)
MCV: 89.9 fl (ref 78.0–100.0)
Monocytes Absolute: 0.4 10*3/uL (ref 0.1–1.0)
Monocytes Relative: 6.9 % (ref 3.0–12.0)
Neutro Abs: 3.2 10*3/uL (ref 1.4–7.7)
Neutrophils Relative %: 63.5 % (ref 43.0–77.0)
Platelets: 322 10*3/uL (ref 150.0–400.0)
RBC: 4.56 Mil/uL (ref 3.87–5.11)
RDW: 13.1 % (ref 11.5–15.5)
WBC: 5.1 10*3/uL (ref 4.0–10.5)

## 2022-09-27 LAB — LIPID PANEL
Cholesterol: 253 mg/dL — ABNORMAL HIGH (ref 0–200)
HDL: 78.7 mg/dL (ref >39.00)
LDL Cholesterol: 157 mg/dL — ABNORMAL HIGH (ref 0–99)
NonHDL: 174.75
Total CHOL/HDL Ratio: 3
Triglycerides: 87 mg/dL (ref 0.0–149.0)
VLDL: 17.4 mg/dL (ref 0.0–40.0)

## 2022-09-27 LAB — VITAMIN D 25 HYDROXY (VIT D DEFICIENCY, FRACTURES): VITD: 32.32 ng/mL (ref 30.00–100.00)

## 2022-09-27 LAB — TSH: TSH: 3.22 u[IU]/mL (ref 0.35–5.50)

## 2022-09-27 NOTE — Progress Notes (Signed)
Marie Gross is a 56 y.o. female with the following history as recorded in [employer]:  Patient Active Problem List   Diagnosis Date Noted   Intramural leiomyoma of uterus 08/28/2015   Migraine headache 07/18/2007   NASAL POLYP 07/18/2007   ALLERGIC RHINITIS 07/18/2007    Current Outpatient Medications  Medication Sig Dispense Refill   Cholecalciferol (VITAMIN D3) 10000 units TABS Take 1 tablet by mouth daily.     Loratadine (CLARITIN PO) Take by mouth as needed.     ondansetron (ZOFRAN ODT) 4 MG disintegrating tablet Take 1 tablet (4 mg total) by mouth every 8 (eight) hours as needed for nausea or vomiting. 20 tablet 2   SUMAtriptan (IMITREX) 100 MG tablet Take 1 tablet (100 mg total) by mouth every 2 (two) hours as needed for migraine. Max dosage is 200mg in 24 hours 27 tablet 4   No current facility-administered medications for this visit.    Allergies: Penicillins, Sulfonamide derivatives, and Tetracycline  Past Medical History:  Diagnosis Date   Abnormal Pap smear 07/2012   ASCUS + HPV, colpo with CIN1   Allergic rhinitis    Complication of anesthesia    Headache(784.0)    History of nasal polyp    HPV exposure 07/2012   Hx of migraines    with Aura   PONV (postoperative nausea and vomiting)     Past Surgical History:  Procedure Laterality Date   BREAST BIOPSY Right    2016   BREAST CYST EXCISION  1998   Benign   BREAST EXCISIONAL BIOPSY     CESAREAN SECTION  94, 95, 98   x3   COLPOSCOPY  08/20/12   CIN 1 +HPV   NASAL POLYP EXCISION     x2   UTERINE ARTERY EMBOLIZATION  9/09   WRIST SURGERY Left 2013    Family History  Problem Relation Age of Onset   Breast cancer Mother        70s   Hyperlipidemia Mother    Parkinson's disease Mother    Skin cancer Mother    Dementia Mother        Lewy body dementia   Hypertension Father    Diabetes Father    Cancer Father        prostate and skin cancer   Coronary artery disease Father    Hyperlipidemia Father     Diabetes Maternal Grandmother    Hypertension Maternal Grandmother    Cancer Maternal Grandfather        renal   Gout Maternal Grandfather    Renal cancer Maternal Grandfather    Hypertension Paternal Grandmother    Hypertension Paternal Grandfather    Heart attack Paternal Grandfather 50    Social History   Tobacco Use   Smoking status: Never   Smokeless tobacco: Never  Substance Use Topics   Alcohol use: Yes    Alcohol/week: 2.0 standard drinks of alcohol    Types: 2 Standard drinks or equivalent per week    Subjective:   Presents for yearly CPE; up to date on preventive care needs; history of migraine headaches- has noticed increase in pattern of migraines in the past 2 months- may have one per week; suspicious that may be related to increased caffeine intake; also notes that stress level has been higher due to job;   Review of Systems  Constitutional: Negative.   HENT: Negative.    Eyes: Negative.   Respiratory: Negative.    Cardiovascular: Negative.   Gastrointestinal: Negative.     Genitourinary: Negative.   Musculoskeletal: Negative.   Skin: Negative.   Neurological:  Positive for headaches.  Endo/Heme/Allergies: Negative.   Psychiatric/Behavioral: Negative.       Objective:  Vitals:   09/27/22 0820  BP: 116/72  Pulse: 72  Resp: 18  Temp: 98.3 F (36.8 C)  TempSrc: Oral  SpO2: 99%  Weight: 113 lb 3.2 oz (51.3 kg)  Height: 5' 2.5" (1.588 m)    General: Well developed, well nourished, in no acute distress  Skin : Warm and dry.  Head: Normocephalic and atraumatic  Eyes: Sclera and conjunctiva clear; pupils round and reactive to light; extraocular movements intact  Ears: External normal; canals clear; tympanic membranes normal  Oropharynx: Pink, supple. No suspicious lesions  Neck: Supple without thyromegaly, adenopathy  Lungs: Respirations unlabored; clear to auscultation bilaterally without wheeze, rales, rhonchi  CVS exam: normal rate and regular  rhythm.  Abdomen: Soft; nontender; nondistended; normoactive bowel sounds; no masses or hepatosplenomegaly  Musculoskeletal: No deformities; no active joint inflammation  Extremities: No edema, cyanosis, clubbing  Vessels: Symmetric bilaterally  Neurologic: Alert and oriented; speech intact; face symmetrical; moves all extremities well; CNII-XII intact without focal deficit   Assessment:  1. PE (physical exam), annual   2. Lipid screening   3. Abnormal laboratory test result   4. Vitamin D deficiency     Plan:  Age appropriate preventive healthcare needs addressed; encouraged regular eye doctor and dental exams; encouraged regular exercise; will update labs and refills as needed today; follow-up to be determined; Suspect stress causing increased headaches- will monitor once job has changed in the new year- if symptoms persist with job change, to consider treatment changes.   No follow-ups on file.  Orders Placed This Encounter  Procedures   CBC with Differential/Platelet   Comp Met (CMET)   Lipid panel   TSH   Vitamin D (25 hydroxy)    Requested Prescriptions    No prescriptions requested or ordered in this encounter

## 2022-10-02 ENCOUNTER — Other Ambulatory Visit (HOSPITAL_COMMUNITY): Payer: Self-pay

## 2022-10-02 ENCOUNTER — Other Ambulatory Visit (HOSPITAL_BASED_OUTPATIENT_CLINIC_OR_DEPARTMENT_OTHER): Payer: Self-pay

## 2022-10-02 ENCOUNTER — Other Ambulatory Visit (HOSPITAL_BASED_OUTPATIENT_CLINIC_OR_DEPARTMENT_OTHER): Payer: Self-pay | Admitting: Obstetrics & Gynecology

## 2022-10-02 DIAGNOSIS — G43709 Chronic migraine without aura, not intractable, without status migrainosus: Secondary | ICD-10-CM

## 2022-10-03 ENCOUNTER — Other Ambulatory Visit (HOSPITAL_COMMUNITY): Payer: Self-pay

## 2022-10-03 ENCOUNTER — Other Ambulatory Visit (HOSPITAL_BASED_OUTPATIENT_CLINIC_OR_DEPARTMENT_OTHER): Payer: Self-pay

## 2022-10-03 MED ORDER — ONDANSETRON 4 MG PO TBDP
4.0000 mg | ORAL_TABLET | Freq: Three times a day (TID) | ORAL | 2 refills | Status: AC | PRN
  Filled 2022-10-03 – 2022-10-06 (×2): qty 20, 7d supply, fill #0
  Filled 2023-05-27: qty 20, 7d supply, fill #1

## 2022-10-06 ENCOUNTER — Other Ambulatory Visit (HOSPITAL_BASED_OUTPATIENT_CLINIC_OR_DEPARTMENT_OTHER): Payer: Self-pay

## 2022-10-06 ENCOUNTER — Other Ambulatory Visit: Payer: Self-pay

## 2022-10-09 ENCOUNTER — Other Ambulatory Visit (HOSPITAL_COMMUNITY): Payer: Self-pay

## 2022-10-16 ENCOUNTER — Other Ambulatory Visit (HOSPITAL_BASED_OUTPATIENT_CLINIC_OR_DEPARTMENT_OTHER): Payer: Self-pay

## 2022-10-25 ENCOUNTER — Other Ambulatory Visit: Payer: Self-pay | Admitting: Family

## 2022-10-25 DIAGNOSIS — Z1231 Encounter for screening mammogram for malignant neoplasm of breast: Secondary | ICD-10-CM

## 2022-12-12 ENCOUNTER — Ambulatory Visit
Admission: RE | Admit: 2022-12-12 | Discharge: 2022-12-12 | Disposition: A | Discharge status: Home / Self Care | Payer: BC Managed Care – PPO | Source: Ambulatory Visit | Attending: Family | Admitting: Family

## 2022-12-12 DIAGNOSIS — Z1231 Encounter for screening mammogram for malignant neoplasm of breast: Secondary | ICD-10-CM | POA: Diagnosis not present

## 2023-03-01 ENCOUNTER — Other Ambulatory Visit (HOSPITAL_COMMUNITY): Payer: Self-pay

## 2023-03-02 ENCOUNTER — Other Ambulatory Visit (HOSPITAL_BASED_OUTPATIENT_CLINIC_OR_DEPARTMENT_OTHER): Payer: Self-pay

## 2023-03-02 ENCOUNTER — Other Ambulatory Visit (HOSPITAL_COMMUNITY): Payer: Self-pay

## 2023-04-03 ENCOUNTER — Other Ambulatory Visit (HOSPITAL_COMMUNITY): Payer: Self-pay

## 2023-05-27 ENCOUNTER — Other Ambulatory Visit: Payer: Self-pay

## 2023-05-27 ENCOUNTER — Other Ambulatory Visit (HOSPITAL_COMMUNITY): Payer: Self-pay

## 2023-05-31 ENCOUNTER — Other Ambulatory Visit: Payer: Self-pay

## 2023-06-01 ENCOUNTER — Other Ambulatory Visit (HOSPITAL_COMMUNITY): Payer: Self-pay

## 2023-06-01 ENCOUNTER — Other Ambulatory Visit (HOSPITAL_BASED_OUTPATIENT_CLINIC_OR_DEPARTMENT_OTHER): Payer: Self-pay

## 2023-06-11 ENCOUNTER — Telehealth: Payer: Self-pay | Admitting: Family

## 2023-06-11 NOTE — Telephone Encounter (Signed)
Pt is [place] out of [place] and requested medical records. Provided number to med records dept and transferred. Pt would also like to know when she last got her MMR, TB and Hep B shots. Pt thinks she may have gotten a Hep B titer. Please call her to advise about immunizations.

## 2023-06-11 NOTE — Telephone Encounter (Signed)
Spoke with pt, advised pt we don't have full access to all of her immunizations however she may be able to obtain them from [facility], provided pt with phone number and email to contact them. Pt also requested a copy of her immunizations be mailed to her, will print off and place up front to be mailed out to pt.

## 2023-08-21 ENCOUNTER — Other Ambulatory Visit (HOSPITAL_COMMUNITY): Payer: Self-pay

## 2023-09-23 ENCOUNTER — Ambulatory Visit (HOSPITAL_BASED_OUTPATIENT_CLINIC_OR_DEPARTMENT_OTHER): Payer: BC Managed Care – PPO | Admitting: Obstetrics & Gynecology

## 2023-10-10 ENCOUNTER — Encounter: Payer: BC Managed Care – PPO | Admitting: Family

## 2023-10-18 ENCOUNTER — Encounter: Payer: BC Managed Care – PPO | Admitting: Family
